# Patient Record
Sex: Male | Born: 1955
Health system: Southern US, Community
[De-identification: ages and names within clinical notes are randomized; demographics above are authoritative.]

## PROBLEM LIST (undated history)

## (undated) DIAGNOSIS — I10 Essential (primary) hypertension: Secondary | ICD-10-CM

## (undated) DIAGNOSIS — E119 Type 2 diabetes mellitus without complications: Secondary | ICD-10-CM

## (undated) DIAGNOSIS — E785 Hyperlipidemia, unspecified: Secondary | ICD-10-CM

## (undated) DIAGNOSIS — N529 Male erectile dysfunction, unspecified: Secondary | ICD-10-CM

## (undated) DIAGNOSIS — E559 Vitamin D deficiency, unspecified: Secondary | ICD-10-CM

## (undated) DIAGNOSIS — D126 Benign neoplasm of colon, unspecified: Secondary | ICD-10-CM

## (undated) DIAGNOSIS — M1611 Unilateral primary osteoarthritis, right hip: Secondary | ICD-10-CM

## (undated) DIAGNOSIS — N183 Chronic kidney disease, stage 3 unspecified: Secondary | ICD-10-CM

## (undated) HISTORY — PX: COLONOSCOPY: SHX174

## (undated) HISTORY — PX: APPENDECTOMY: SHX54

---

## 2008-05-17 ENCOUNTER — Ambulatory Visit: Payer: Self-pay | Admitting: Gastroenterology

## 2011-09-05 ENCOUNTER — Encounter: Payer: Self-pay | Admitting: Urology

## 2011-10-03 ENCOUNTER — Encounter: Payer: Self-pay | Admitting: Urology

## 2012-02-10 ENCOUNTER — Encounter: Payer: Self-pay | Admitting: Urology

## 2012-02-10 LAB — CBC WITH DIFFERENTIAL/PLATELET
Basophil #: 0 10*3/uL (ref 0.0–0.1)
HCT: 45.8 % (ref 40.0–52.0)
Lymphocyte #: 1.5 10*3/uL (ref 1.0–3.6)
MCHC: 32.1 g/dL (ref 32.0–36.0)
Monocyte #: 0.5 x10 3/mm (ref 0.2–1.0)
Monocyte %: 7 %
Neutrophil %: 67.7 %
Platelet: 148 10*3/uL — ABNORMAL LOW (ref 150–440)
WBC: 7 10*3/uL (ref 3.8–10.6)

## 2014-04-17 ENCOUNTER — Ambulatory Visit: Payer: Self-pay | Admitting: Gastroenterology

## 2014-04-20 LAB — PATHOLOGY REPORT

## 2016-04-12 ENCOUNTER — Encounter: Payer: Self-pay | Admitting: Emergency Medicine

## 2016-04-12 ENCOUNTER — Emergency Department
Admission: EM | Admit: 2016-04-12 | Discharge: 2016-04-12 | Disposition: A | Discharge status: Home / Self Care | Payer: Worker's Compensation | Attending: Emergency Medicine | Admitting: Emergency Medicine

## 2016-04-12 DIAGNOSIS — Y990 Civilian activity done for income or pay: Secondary | ICD-10-CM | POA: Diagnosis not present

## 2016-04-12 DIAGNOSIS — Y929 Unspecified place or not applicable: Secondary | ICD-10-CM | POA: Insufficient documentation

## 2016-04-12 DIAGNOSIS — T63331A Toxic effect of venom of brown recluse spider, accidental (unintentional), initial encounter: Secondary | ICD-10-CM | POA: Insufficient documentation

## 2016-04-12 DIAGNOSIS — Y9389 Activity, other specified: Secondary | ICD-10-CM | POA: Insufficient documentation

## 2016-04-12 DIAGNOSIS — T63301A Toxic effect of unspecified spider venom, accidental (unintentional), initial encounter: Secondary | ICD-10-CM

## 2016-04-12 DIAGNOSIS — X58XXXA Exposure to other specified factors, initial encounter: Secondary | ICD-10-CM | POA: Diagnosis not present

## 2016-04-12 DIAGNOSIS — Z87891 Personal history of nicotine dependence: Secondary | ICD-10-CM | POA: Diagnosis not present

## 2016-04-12 DIAGNOSIS — M25531 Pain in right wrist: Secondary | ICD-10-CM | POA: Diagnosis present

## 2016-04-12 MED ORDER — HYDROCORTISONE 2.5 % EX OINT: TOPICAL_OINTMENT | Freq: Two times a day (BID) | CUTANEOUS | 0 refills | Status: DC

## 2016-04-12 MED ORDER — NAPROXEN 500 MG PO TABS: 500.0000 mg | ORAL_TABLET | Freq: Two times a day (BID) | ORAL | 0 refills | Status: DC

## 2016-04-12 NOTE — ED Triage Notes (Signed)
Spider bite inner R wrist at work today, noted reddened and swollen.

## 2016-04-12 NOTE — ED Notes (Signed)
Patient reports bitten by ?spider today at approximately 10 am.  Patient reports area to right inner wrist with pain and swelling.  Slight discoloration noted to right inner wrist.

## 2016-04-12 NOTE — ED Notes (Signed)
Spoke with Mike Craze is the Regional Manager and he stated " We do not need urine or blood work done just take care of the bite". Contact number [phone] for any further questions

## 2016-04-12 NOTE — ED Notes (Signed)
Abby from registration could not find information for workers comp on [employer]. Attempted to call contact person Clement Husbands to find out if blood or urine needed to be obtained from patient for workers comp. They were closed and I got the voicemail.

## 2016-04-12 NOTE — ED Provider Notes (Signed)
[facility] Emergency Department Provider Note  ____________________________________________  Time seen: Approximately 7:17 PM  I have reviewed the triage vital signs and the nursing notes.   HISTORY  Chief Complaint Insect Bite   HPI CURRIE HEDRICK is a 60 y.o. male who presents to the emergency department for evaluation of wrist pain and swelling. He states that while at work today, he was bitten by a spider. He states that he felt something stinging/biting his wrist and when he rolled off his shirt sleeves he saw a little brown spider. He states that he killed the spider and went on working but the area has continued to swell and he now feels that his skin has changed and "feels like cow hide." He has taken Benadryl without relief.  History reviewed. No pertinent past medical history.  There are no active problems to display for this patient.   Past Surgical History:  Procedure Laterality Date  . APPENDECTOMY      Prior to Admission medications   Medication Sig Start Date End Date Taking? Authorizing Provider  Multiple Vitamin (MULTIVITAMIN) tablet Take 1 tablet by mouth daily.   Yes Historical Provider, MD  hydrocortisone 2.5 % ointment Apply topically 2 (two) times daily. 04/12/16   Victorino Dike, FNP  naproxen (NAPROSYN) 500 MG tablet Take 1 tablet (500 mg total) by mouth 2 (two) times daily with a meal. 04/12/16   Victorino Dike, FNP    Allergies Review of patient's allergies indicates no known allergies.  No family history on file.  Social History Social History  Substance Use Topics  . Smoking status: Former Research scientist (life sciences)  . Smokeless tobacco: Never Used  . Alcohol use Yes    Review of Systems  Constitutional: Negative for fever/chills Respiratory: Negative for shortness of breath. Musculoskeletal: Negative for pain. Skin: Positive for pain and erythema Neurological: Negative for headaches, focal weakness or  numbness. ____________________________________________   PHYSICAL EXAM:  VITAL SIGNS: ED Triage Vitals  Enc Vitals Group     BP 04/12/16 1901 (!) 142/103     Pulse Rate 04/12/16 1901 89     Resp 04/12/16 1901 18     Temp 04/12/16 1901 97.9 F (36.6 C)     Temp Source 04/12/16 1901 Oral     SpO2 04/12/16 1901 94 %     Weight 04/12/16 1902 223 lb (101.2 kg)     Height 04/12/16 1902 6\' 4"  (1.93 m)     Head Circumference --      Peak Flow --      Pain Score 04/12/16 1902 8     Pain Loc --      Pain Edu? --      Excl. in [place]? --      Constitutional: Alert and oriented. Well appearing and in no acute distress. Eyes: Conjunctivae are normal. EOMI. Nose: No congestion/rhinnorhea. Mouth/Throat: Mucous membranes are moist.   Neck: No stridor. Cardiovascular: Good peripheral circulation. Respiratory: Normal respiratory effort.  No retractions. Musculoskeletal: FROM throughout. Neurologic:  Normal speech and language. No gross focal neurologic deficits are appreciated. Skin:  Anterior aspect of the right wrist with mild edema and erythema. No lymphangitis noted.  ____________________________________________   LABS (all labs ordered are listed, but only abnormal results are displayed)  Labs Reviewed - No data to display ____________________________________________  EKG   ____________________________________________  RADIOLOGY  Not indicated ____________________________________________   PROCEDURES  Procedure(s) performed: None ____________________________________________   INITIAL IMPRESSION / ASSESSMENT AND PLAN / ED  COURSE  Pertinent labs & imaging results that were available during my care of the patient were reviewed by me and considered in my medical decision making (see chart for details).  He will be advised to use the hydrocortisone cream and take the Naprosyn as prescribed.  He was advised to follow up with [facility] clinic if not improving over the next  2-3 days.  He was also advised to return to the emergency department for symptoms that change or worsen if unable to schedule an appointment.  ____________________________________________   FINAL CLINICAL IMPRESSION(S) / ED DIAGNOSES  Final diagnoses:  Spider bite, accidental or unintentional, initial encounter    Discharge Medication List as of 04/12/2016  7:48 PM    START taking these medications   Details  hydrocortisone 2.5 % ointment Apply topically 2 (two) times daily., Starting Sat 04/12/2016, Print    naproxen (NAPROSYN) 500 MG tablet Take 1 tablet (500 mg total) by mouth 2 (two) times daily with a meal., Starting Sat 04/12/2016, Print        Note:  This document was prepared using Dragon voice recognition software and may include unintentional dictation errors.    Victorino Dike, FNP 04/12/16 1958    Earleen [place], MD 04/12/16 2258

## 2016-04-13 ENCOUNTER — Encounter: Payer: Self-pay | Admitting: *Deleted

## 2016-04-13 ENCOUNTER — Emergency Department
Admission: EM | Admit: 2016-04-13 | Discharge: 2016-04-13 | Disposition: A | Discharge status: Home / Self Care | Payer: Worker's Compensation | Attending: Emergency Medicine | Admitting: Emergency Medicine

## 2016-04-13 DIAGNOSIS — W57XXXA Bitten or stung by nonvenomous insect and other nonvenomous arthropods, initial encounter: Secondary | ICD-10-CM | POA: Diagnosis not present

## 2016-04-13 DIAGNOSIS — Z87891 Personal history of nicotine dependence: Secondary | ICD-10-CM | POA: Insufficient documentation

## 2016-04-13 DIAGNOSIS — L03113 Cellulitis of right upper limb: Secondary | ICD-10-CM | POA: Insufficient documentation

## 2016-04-13 DIAGNOSIS — S60861D Insect bite (nonvenomous) of right wrist, subsequent encounter: Secondary | ICD-10-CM | POA: Diagnosis present

## 2016-04-13 MED ORDER — CEPHALEXIN 500 MG PO CAPS: 500.0000 mg | ORAL_CAPSULE | Freq: Two times a day (BID) | ORAL | 0 refills | Status: DC

## 2016-04-13 NOTE — ED Triage Notes (Signed)
Pt was seen last night for a spider bite to right arm, pt reports arm is more painful and swollen today

## 2016-04-13 NOTE — ED Notes (Signed)
See triage note was seen last pm fro same  States this am his right arm is more swollen and tender to touch

## 2016-04-13 NOTE — ED Provider Notes (Signed)
[facility] Emergency Department Provider Note   ____________________________________________    I have reviewed the triage vital signs and the nursing notes.   HISTORY  Chief Complaint Insect Bite     HPI Martin Church is a 60 y.o. male who was seen yesterday for a spider bite on his right wrist. He returns today because he has developed mild erythema, mild swelling and burning sensation that is traveling away from the spider bite proximally. He denies fevers or chills. He lifts heavy objects for work and has increased pain   History reviewed. No pertinent past medical history.  There are no active problems to display for this patient.   Past Surgical History:  Procedure Laterality Date  . APPENDECTOMY      Prior to Admission medications   Medication Sig Start Date End Date Taking? Authorizing Provider  cephALEXin (KEFLEX) 500 MG capsule Take 1 capsule (500 mg total) by mouth 2 (two) times daily. 04/13/16   Lavonia Drafts, MD  hydrocortisone 2.5 % ointment Apply topically 2 (two) times daily. 04/12/16   Victorino Dike, FNP  Multiple Vitamin (MULTIVITAMIN) tablet Take 1 tablet by mouth daily.    Historical Provider, MD  naproxen (NAPROSYN) 500 MG tablet Take 1 tablet (500 mg total) by mouth 2 (two) times daily with a meal. 04/12/16   Victorino Dike, FNP     Allergies Review of patient's allergies indicates no known allergies.  No family history on file.  Social History Social History  Substance Use Topics  . Smoking status: Former Research scientist (life sciences)  . Smokeless tobacco: Never Used  . Alcohol use Yes    Review of Systems  Constitutional: No fever/chills     Gastrointestinal:   No nausea  Musculoskeletal: Forearm pain Skin: Mild erythema     ____________________________________________   PHYSICAL EXAM:  VITAL SIGNS: ED Triage Vitals  Enc Vitals Group     BP 04/13/16 0957 (!) 158/109     Pulse Rate 04/13/16 0957 83     Resp  04/13/16 0957 18     Temp 04/13/16 0957 98.1 F (36.7 C)     Temp Source 04/13/16 0957 Oral     SpO2 04/13/16 0957 98 %     Weight 04/13/16 0952 223 lb (101.2 kg)     Height 04/13/16 0952 6\' 4"  (1.93 m)     Head Circumference --      Peak Flow --      Pain Score 04/13/16 0953 9     Pain Loc --      Pain Edu? --      Excl. in [place]? --     Constitutional: Alert and oriented. No acute distress. Pleasant and interactive Eyes: Conjunctivae are normal.  Head: Atraumatic. Nose: No congestion/rhinnorhea. Mouth/Throat: Mucous membranes are moist.   Cardiovascular: Normal rate, regular rhythm.  Respiratory: Normal respiratory effort.  No retractions. Genitourinary: deferred Musculoskeletal: No lower extremity tenderness nor edema.   Neurologic:  Normal speech and language. No gross focal neurologic deficits are appreciated.   Skin:  Skin is warm, dry and intact. Mild erythema extending proximally from "spider bite" on the right wrist. He has mild tenderness to palpation along the erythema but no fluctuance. Consistent with cellulitis   ____________________________________________   LABS (all labs ordered are listed, but only abnormal results are displayed)  Labs Reviewed - No data to display ____________________________________________  EKG   ____________________________________________  RADIOLOGY  None ____________________________________________   PROCEDURES  Procedure(s) performed:  No    Critical Care performed: No ____________________________________________   INITIAL IMPRESSION / ASSESSMENT AND PLAN / ED COURSE  Pertinent labs & imaging results that were available during my care of the patient were reviewed by me and considered in my medical decision making (see chart for details).  Suspect cellulitis secondary to puncture wound, I'll start the patient on Keflex.   ____________________________________________   FINAL CLINICAL IMPRESSION(S) / ED  DIAGNOSES  Final diagnoses:  Cellulitis of right upper extremity      NEW MEDICATIONS STARTED DURING THIS VISIT:  New Prescriptions   CEPHALEXIN (KEFLEX) 500 MG CAPSULE    Take 1 capsule (500 mg total) by mouth 2 (two) times daily.     Note:  This document was prepared using Dragon voice recognition software and may include unintentional dictation errors.    Lavonia Drafts, MD 04/13/16 1007

## 2017-02-11 ENCOUNTER — Encounter (HOSPITAL_COMMUNITY): Payer: Self-pay | Admitting: Emergency Medicine

## 2017-02-11 ENCOUNTER — Ambulatory Visit (INDEPENDENT_AMBULATORY_CARE_PROVIDER_SITE_OTHER): Payer: Worker's Compensation

## 2017-02-11 ENCOUNTER — Ambulatory Visit (HOSPITAL_COMMUNITY)
Admission: EM | Admit: 2017-02-11 | Discharge: 2017-02-11 | Disposition: A | Discharge status: Home / Self Care | Payer: Worker's Compensation | Attending: Family Medicine | Admitting: Family Medicine

## 2017-02-11 DIAGNOSIS — R030 Elevated blood-pressure reading, without diagnosis of hypertension: Secondary | ICD-10-CM

## 2017-02-11 DIAGNOSIS — W231XXA Caught, crushed, jammed, or pinched between stationary objects, initial encounter: Secondary | ICD-10-CM | POA: Diagnosis not present

## 2017-02-11 DIAGNOSIS — S62666A Nondisplaced fracture of distal phalanx of right little finger, initial encounter for closed fracture: Secondary | ICD-10-CM

## 2017-02-11 DIAGNOSIS — S6991XA Unspecified injury of right wrist, hand and finger(s), initial encounter: Secondary | ICD-10-CM

## 2017-02-11 MED ORDER — HYDROCODONE-ACETAMINOPHEN 5-325 MG PO TABS: 1.0000 | ORAL_TABLET | Freq: Four times a day (QID) | ORAL | 0 refills | Status: DC | PRN

## 2017-02-11 NOTE — ED Provider Notes (Signed)
[place]    CSN: [id number] Arrival date & time: 02/11/17  1646     History   Chief Complaint Chief Complaint  Patient presents with  . Finger Injury    HPI DOMNIC VANTOL is a 61 y.o. male. Reports crush injury to R 5th finger approx 8 hours ago. 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 Immediately painful. Swollen. No bleeding. Ibuprofen 600mg  without much help. No sensation changes.  HPI  History reviewed. No pertinent past medical history.  There are no active problems to display for this patient.   Past Surgical History:  Procedure Laterality Date  . APPENDECTOMY         Home Medications    Prior to Admission medications   Medication Sig Start Date End Date Taking? Authorizing Provider  ibuprofen (ADVIL,MOTRIN) 800 MG tablet Take 800 mg by mouth every 8 (eight) hours as needed.   Yes [provider]  Multiple Vitamin (MULTIVITAMIN) tablet Take 1 tablet by mouth daily.   Yes [provider]    Family History History reviewed. No pertinent family history.  Social History Social History  Substance Use Topics  . Smoking status: Former Research scientist (life sciences)  . Smokeless tobacco: Never Used  . Alcohol use Yes     Allergies   Patient has no known allergies.   Review of Systems Review of Systems No sensation changes R 5th finger. No bleeding or breakage of skin. Decreased motion distal R 5th finger.  Physical Exam Triage Vital Signs ED Triage Vitals  Enc Vitals Group     BP 02/11/17 1703 (!) 151/86     Pulse Rate 02/11/17 1703 79     Resp 02/11/17 1703 18     Temp 02/11/17 1703 98 F (36.7 C)     Temp Source 02/11/17 1703 Oral     SpO2 02/11/17 1703 97 %     Weight --      Height --      Head Circumference --      Peak Flow --      Pain Score 02/11/17 1659 9     Pain Loc --      Pain Edu? --      Excl. in [place]? --    No data found.   Updated Vital Signs BP (!)  151/86 (BP Location: Left Arm)   Pulse 79   Temp 98 F (36.7 C) (Oral)   Resp 18   SpO2 97%    Physical Exam  Constitutional: He appears well-developed and well-nourished. No distress.  R 5th finger: Decreased ROM at DIP. Significant swelling distally. Tender to touch. No subungual hematoma. Mild ecchymosis. Nail intact. Sensation intact. Normal ROM at PIP.   UC Treatments / Results  Labs (all labs ordered are listed, but only abnormal results are displayed) Labs Reviewed - No data to display  EKG  EKG Interpretation None       Radiology No results found.  Procedures Procedures (including critical care time)  Medications Ordered in UC Medications - No data to display   Initial Impression / Assessment and Plan / UC Course  I have reviewed the triage vital signs and the nursing notes.  Pertinent labs & imaging results that were available during my care of the patient were reviewed by me and considered in my medical decision making (see chart for details).      Final Clinical Impressions(s) / UC Diagnoses   Final  diagnoses:  Finger injury, right, initial encounter  Elevated blood pressure reading in office without diagnosis of hypertension  Nondisplaced fracture of distal phalanx of right little finger, initial encounter for closed fracture   Comminuted distal finger fracture observed on x-ray. DIP splinted in full extension. To schedule f/u with [facility]. Number given. Ibuprofen as needed. Hydrocodone for prn use. Sedation precautions.  Work restriction note printed.  Recommended monitoring slightly increased BP reading while in office today. Recommend establishing care with PCP.   New Prescriptions Discharge Medication List as of 02/11/2017  6:10 PM    START taking these medications   Details  HYDROcodone-acetaminophen (NORCO/VICODIN) 5-325 MG tablet Take 1 tablet by mouth every 6 (six) hours as needed for moderate pain or severe pain.,  Starting Wed 02/11/2017, Print         Vanessa Kick, MD 02/11/17 [phone]

## 2017-02-11 NOTE — ED Triage Notes (Signed)
The patient presented to the [facility] with a complaint of pain and swelling to the fifth finger on his right hand secondary to a crane hitting it and pinning it against a burial vault earlier today.

## 2017-04-17 ENCOUNTER — Other Ambulatory Visit: Payer: Self-pay | Admitting: Internal Medicine

## 2017-04-17 DIAGNOSIS — R7989 Other specified abnormal findings of blood chemistry: Secondary | ICD-10-CM

## 2017-04-23 ENCOUNTER — Ambulatory Visit: Payer: PRIVATE HEALTH INSURANCE

## 2017-04-23 ENCOUNTER — Ambulatory Visit
Admission: RE | Admit: 2017-04-23 | Discharge: 2017-04-23 | Disposition: A | Discharge status: Home / Self Care | Payer: No Typology Code available for payment source | Source: Ambulatory Visit | Attending: Internal Medicine | Admitting: Internal Medicine

## 2017-04-23 DIAGNOSIS — N281 Cyst of kidney, acquired: Secondary | ICD-10-CM | POA: Insufficient documentation

## 2017-04-23 DIAGNOSIS — R7989 Other specified abnormal findings of blood chemistry: Secondary | ICD-10-CM | POA: Diagnosis present

## 2017-12-02 DIAGNOSIS — H524 Presbyopia: Secondary | ICD-10-CM | POA: Diagnosis not present

## 2017-12-02 DIAGNOSIS — H52223 Regular astigmatism, bilateral: Secondary | ICD-10-CM | POA: Diagnosis not present

## 2017-12-02 DIAGNOSIS — H5203 Hypermetropia, bilateral: Secondary | ICD-10-CM | POA: Diagnosis not present

## 2017-12-23 DIAGNOSIS — N401 Enlarged prostate with lower urinary tract symptoms: Secondary | ICD-10-CM | POA: Diagnosis not present

## 2017-12-23 DIAGNOSIS — E291 Testicular hypofunction: Secondary | ICD-10-CM | POA: Diagnosis not present

## 2017-12-23 DIAGNOSIS — Z79899 Other long term (current) drug therapy: Secondary | ICD-10-CM | POA: Diagnosis not present

## 2018-01-11 DIAGNOSIS — N5201 Erectile dysfunction due to arterial insufficiency: Secondary | ICD-10-CM | POA: Diagnosis not present

## 2018-01-11 DIAGNOSIS — N401 Enlarged prostate with lower urinary tract symptoms: Secondary | ICD-10-CM | POA: Diagnosis not present

## 2018-01-11 DIAGNOSIS — Z79899 Other long term (current) drug therapy: Secondary | ICD-10-CM | POA: Diagnosis not present

## 2018-01-11 DIAGNOSIS — E291 Testicular hypofunction: Secondary | ICD-10-CM | POA: Diagnosis not present

## 2018-05-30 IMAGING — US US RENAL
1 series · 14 of 25 positions shown · non-contrast
Comparison: None.

CLINICAL DATA: Elevated creatinine

EXAM:
RENAL / URINARY TRACT ULTRASOUND COMPLETE

[Series 1: us renal · 0.25mm/px · 14 of 43 slices shown]
[im 1/43]
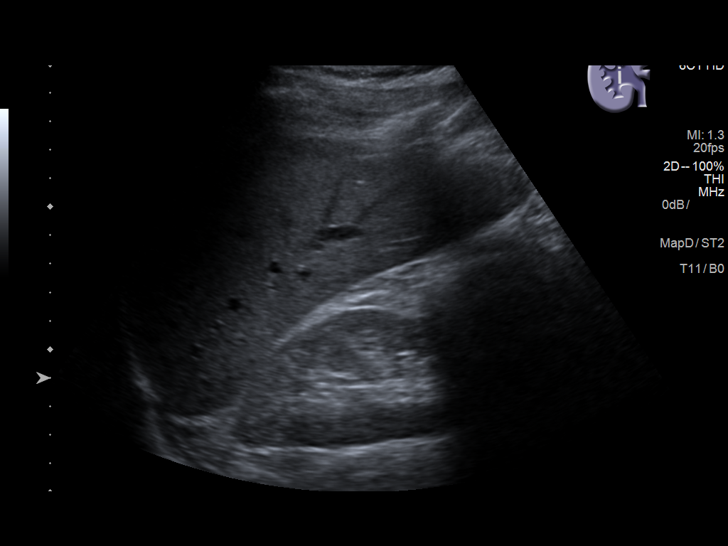
[im 4/43]
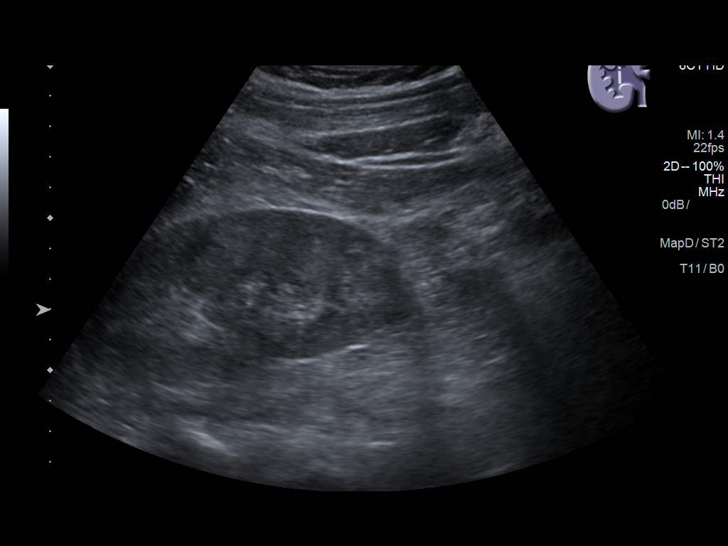
[im 8/43]
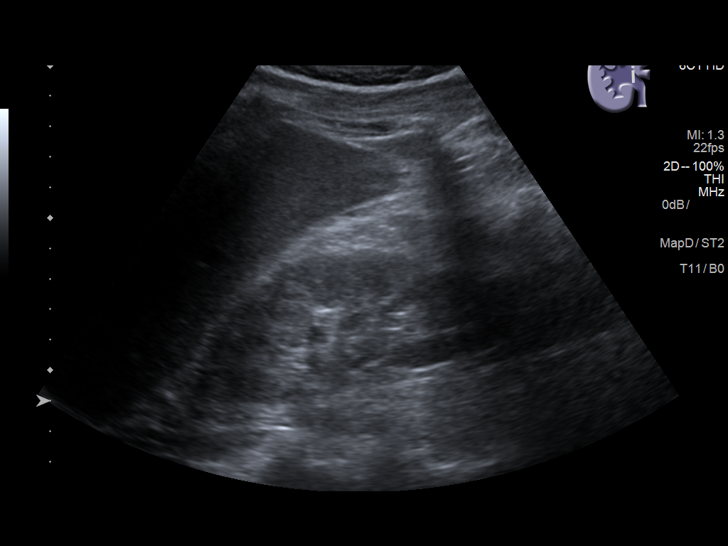
[im 11/43]
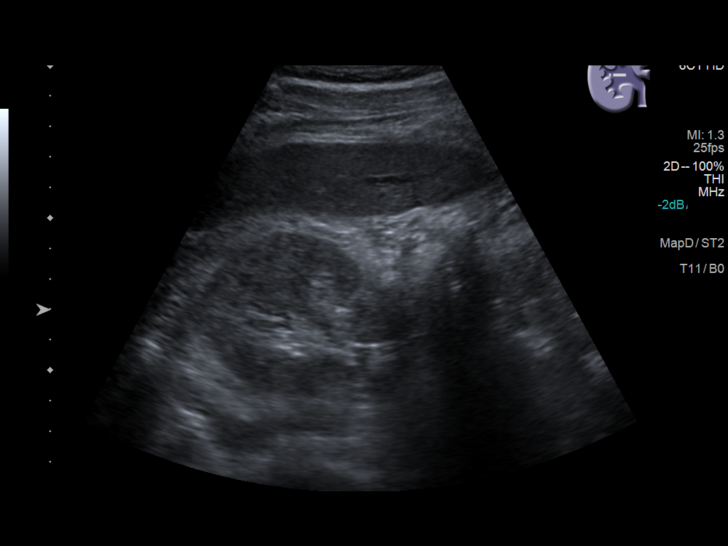
[im 15/43]
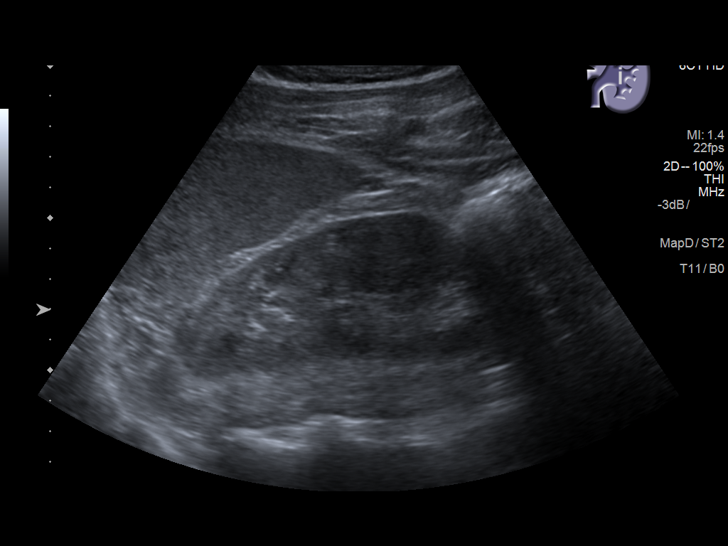
[im 16/43]
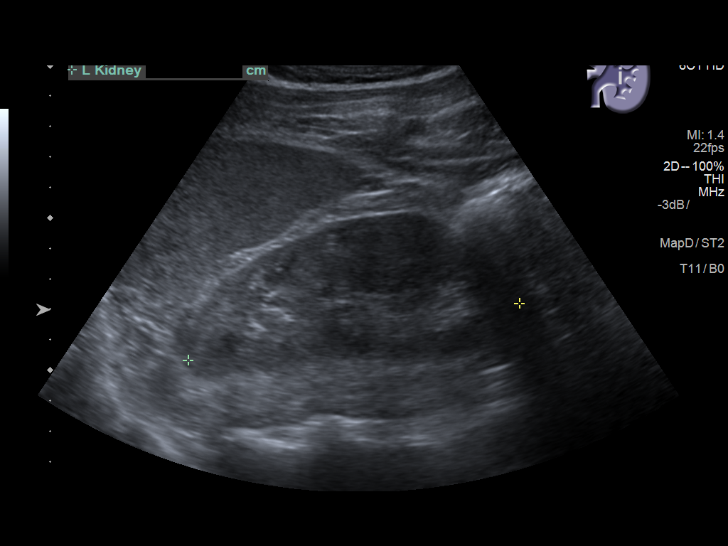
[im 20/43]
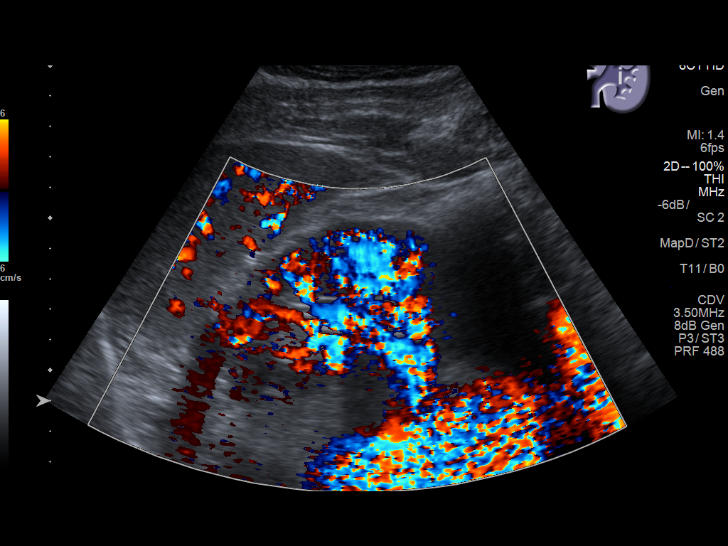
[im 23/43]
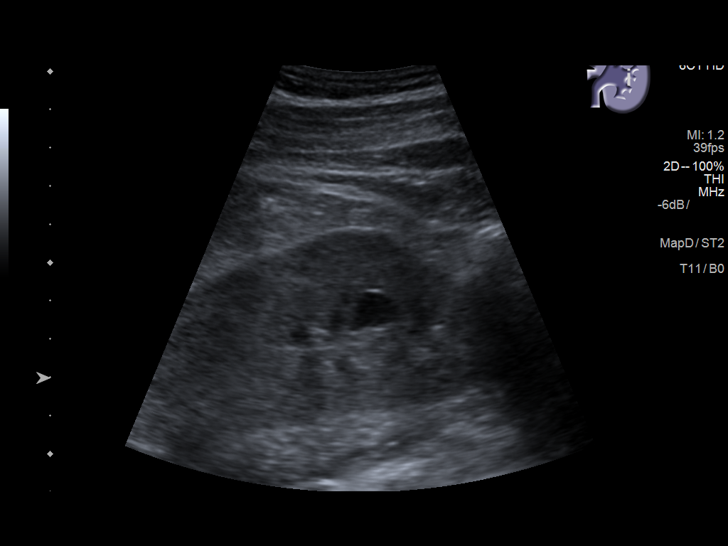
[im 27/43]
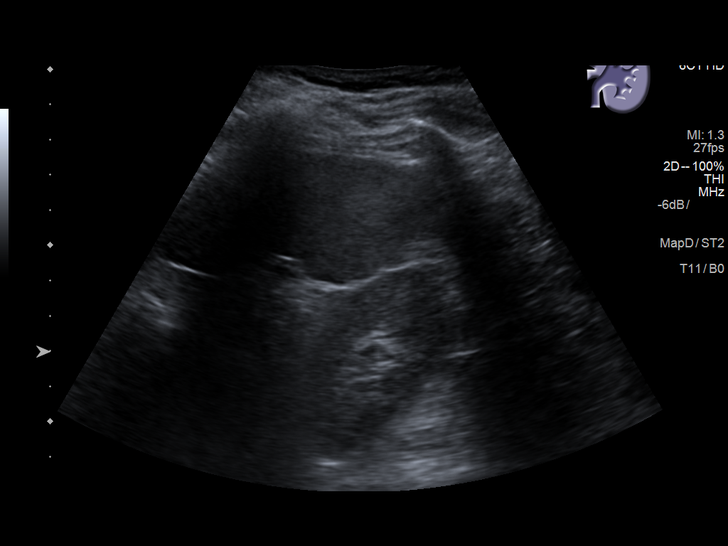
[im 29/43]
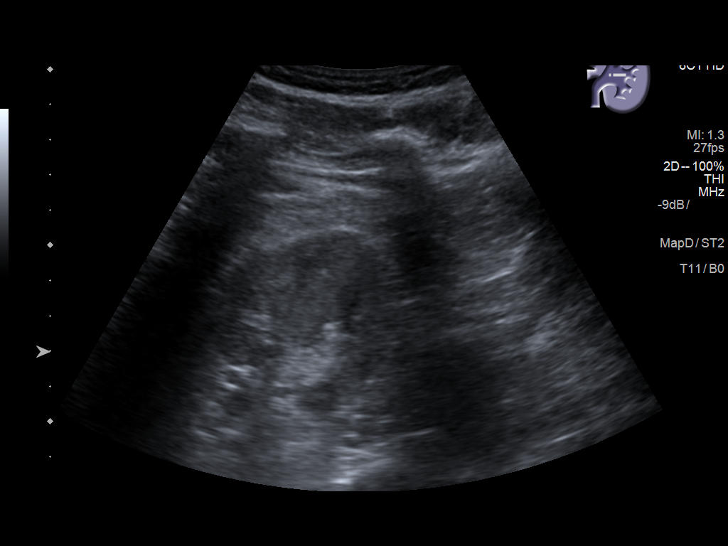
[im 32/43]
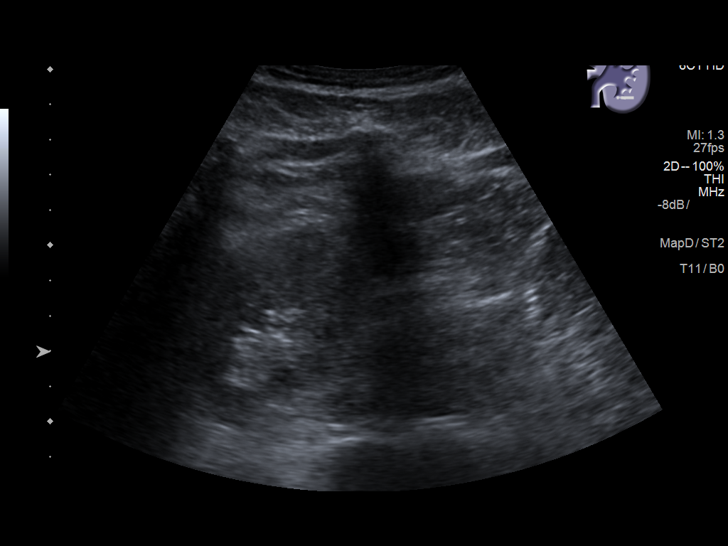
[im 36/43]
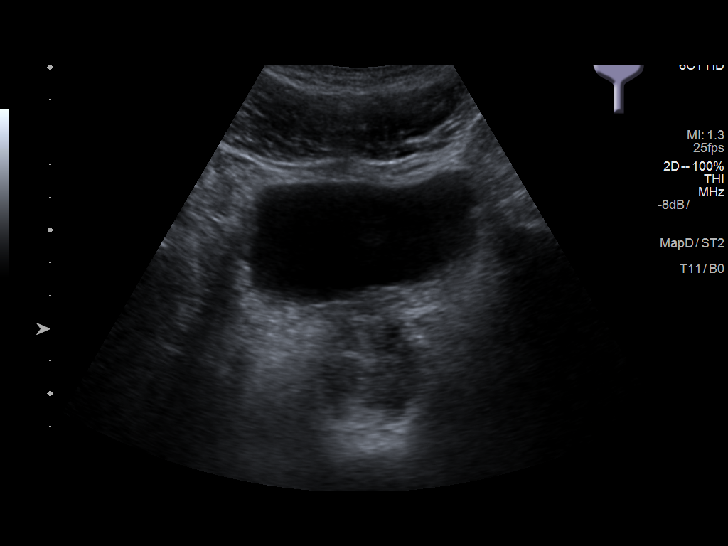
[im 39/43]
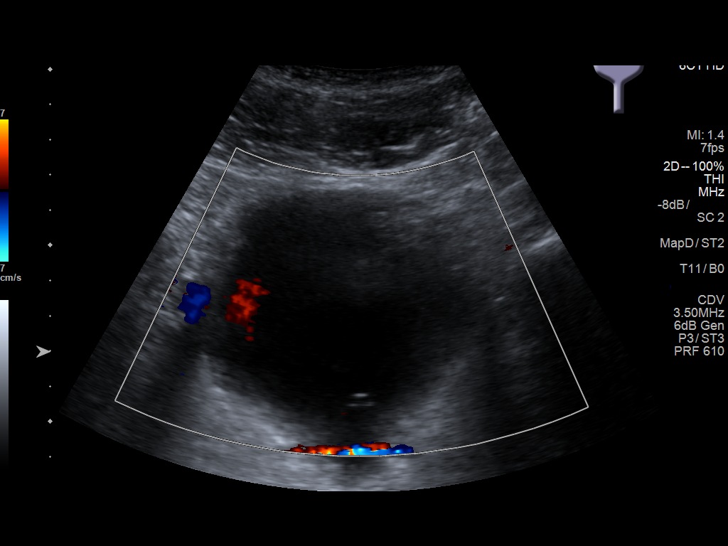
[im 43/43]
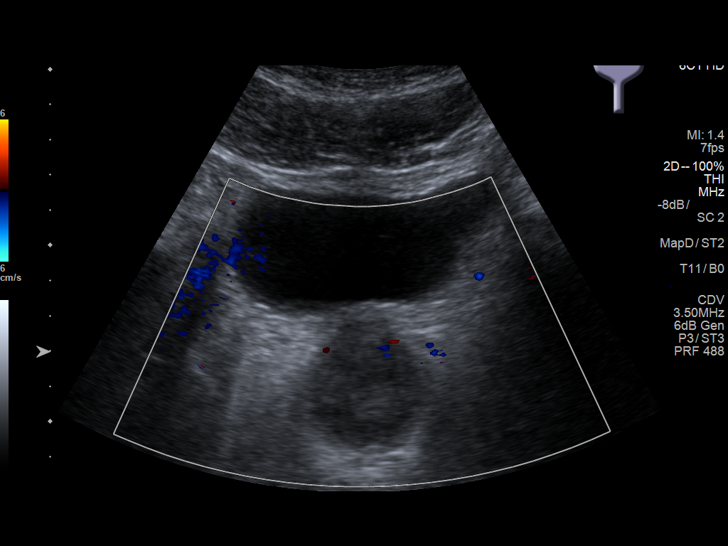

[14 of 25 positions shown; findings below may reference images not displayed]

FINDINGS: Right Kidney:

Length: 11.3 cm. Echogenicity within normal limits. No mass or
hydronephrosis visualized.

Left Kidney:

Length: 11 cm.  Contains a 1.3 cm cyst

Bladder:

Appears normal for degree of bladder distention.
IMPRESSION: 1. Small left renal cyst.  No acute renal abnormalities.
2. The prostate measures 3.9 x 3.5 x 3.5 cm.

## 2018-06-09 DIAGNOSIS — Z79899 Other long term (current) drug therapy: Secondary | ICD-10-CM | POA: Diagnosis not present

## 2018-06-09 DIAGNOSIS — N401 Enlarged prostate with lower urinary tract symptoms: Secondary | ICD-10-CM | POA: Diagnosis not present

## 2018-06-09 DIAGNOSIS — E291 Testicular hypofunction: Secondary | ICD-10-CM | POA: Diagnosis not present

## 2018-06-18 ENCOUNTER — Ambulatory Visit
Admission: RE | Admit: 2018-06-18 | Discharge: 2018-06-18 | Disposition: A | Discharge status: Home / Self Care | Payer: 59 | Source: Ambulatory Visit | Attending: Urology | Admitting: Urology

## 2018-06-18 DIAGNOSIS — Z79899 Other long term (current) drug therapy: Secondary | ICD-10-CM | POA: Insufficient documentation

## 2018-06-18 DIAGNOSIS — D751 Secondary polycythemia: Secondary | ICD-10-CM | POA: Diagnosis not present

## 2018-06-18 LAB — CBC
HCT: 52 % (ref 39.0–52.0)
HEMOGLOBIN: 17.6 g/dL — AB (ref 13.0–17.0)
MCH: 28.1 pg (ref 26.0–34.0)
MCHC: 33.8 g/dL (ref 30.0–36.0)
MCV: 83.1 fL (ref 80.0–100.0)
NRBC: 0 % (ref 0.0–0.2)
PLATELETS: 153 10*3/uL (ref 150–400)
RBC: 6.26 MIL/uL — AB (ref 4.22–5.81)
RDW: 12.2 % (ref 11.5–15.5)
WBC: 3.9 10*3/uL — ABNORMAL LOW (ref 4.0–10.5)

## 2018-06-18 NOTE — Progress Notes (Signed)
Phlebotomy performed: 500 ml drained off. CBC sample sent to lab.Pt. Tolerated procedure well. Asked pt. To monitor BP at home and call primary MD office if remains elevated. Pt. Verbalized understanding. Asymptomatic on arrival and upon DC home. Stable for DC.

## 2018-06-25 ENCOUNTER — Ambulatory Visit
Admission: RE | Admit: 2018-06-25 | Discharge: 2018-06-25 | Disposition: A | Discharge status: Home / Self Care | Payer: 59 | Source: Ambulatory Visit | Attending: Orthopedic Surgery | Admitting: Orthopedic Surgery

## 2018-06-25 DIAGNOSIS — D751 Secondary polycythemia: Secondary | ICD-10-CM | POA: Insufficient documentation

## 2018-06-25 DIAGNOSIS — Z79899 Other long term (current) drug therapy: Secondary | ICD-10-CM | POA: Diagnosis not present

## 2018-06-25 LAB — HEMOGLOBIN AND HEMATOCRIT, BLOOD
HEMATOCRIT: 46.6 % (ref 39.0–52.0)
HEMOGLOBIN: 15.4 g/dL (ref 13.0–17.0)

## 2018-06-25 NOTE — Discharge Instructions (Signed)
[facility] INSTRUCTIONS FOR THERAPEUTIC PHLEBOTOMY  1.  Remain in [facility] Recovery for 10 to 15 minutes after the phlebotomy has been performed.  Do not leave until you are released by the nurse.   2. Drink more fluids than usual for about four hours after you have had a blood draw.  It is advised that you do not have any alcohol until you have eaten.  Do not smoke for one half hour.  3. If there is bleeding from the phlebotomy site, elevate your arm and apply direct pressure over the needle puncture site.  If this does not stop the bleeding, you should return to the [place] area or notify your physician as soon as possible.  4. If you feel fain or dizzy, lie down immediately, if possible, or sit with your head bent forward towards your knees.  Stay seated or lying flat until all dizziness has passed.  Drink plenty of fluids as soon as you are able and keep your activity to a minimum for at least an hour.   5. If any of the above symptoms persist contact your primary care physician or closest emergency room.   6. You may resume your normal activities after about one-half hour if you are feeling the same as you usually do.  7. Remove the bandage after 4 hours.

## 2018-08-04 DIAGNOSIS — E291 Testicular hypofunction: Secondary | ICD-10-CM | POA: Diagnosis not present

## 2018-08-16 DIAGNOSIS — E291 Testicular hypofunction: Secondary | ICD-10-CM | POA: Diagnosis not present

## 2018-08-16 DIAGNOSIS — N401 Enlarged prostate with lower urinary tract symptoms: Secondary | ICD-10-CM | POA: Diagnosis not present

## 2018-08-16 DIAGNOSIS — N5201 Erectile dysfunction due to arterial insufficiency: Secondary | ICD-10-CM | POA: Diagnosis not present

## 2019-01-03 DIAGNOSIS — N401 Enlarged prostate with lower urinary tract symptoms: Secondary | ICD-10-CM | POA: Diagnosis not present

## 2019-01-03 DIAGNOSIS — E291 Testicular hypofunction: Secondary | ICD-10-CM | POA: Diagnosis not present

## 2019-01-03 DIAGNOSIS — Z79899 Other long term (current) drug therapy: Secondary | ICD-10-CM | POA: Diagnosis not present

## 2019-02-14 DIAGNOSIS — E291 Testicular hypofunction: Secondary | ICD-10-CM | POA: Diagnosis not present

## 2019-02-23 DIAGNOSIS — N5201 Erectile dysfunction due to arterial insufficiency: Secondary | ICD-10-CM | POA: Diagnosis not present

## 2019-02-23 DIAGNOSIS — E291 Testicular hypofunction: Secondary | ICD-10-CM | POA: Diagnosis not present

## 2019-05-07 IMAGING — DX DG FINGER LITTLE 2+V*R*
2 series · 2 of 2 positions shown · non-contrast
Comparison: None

CLINICAL DATA: 60-year-old male status post smash injury to the
right fifth finger today. Pain and swelling.

EXAM:
RIGHT LITTLE FINGER 2+V

[finger ap]
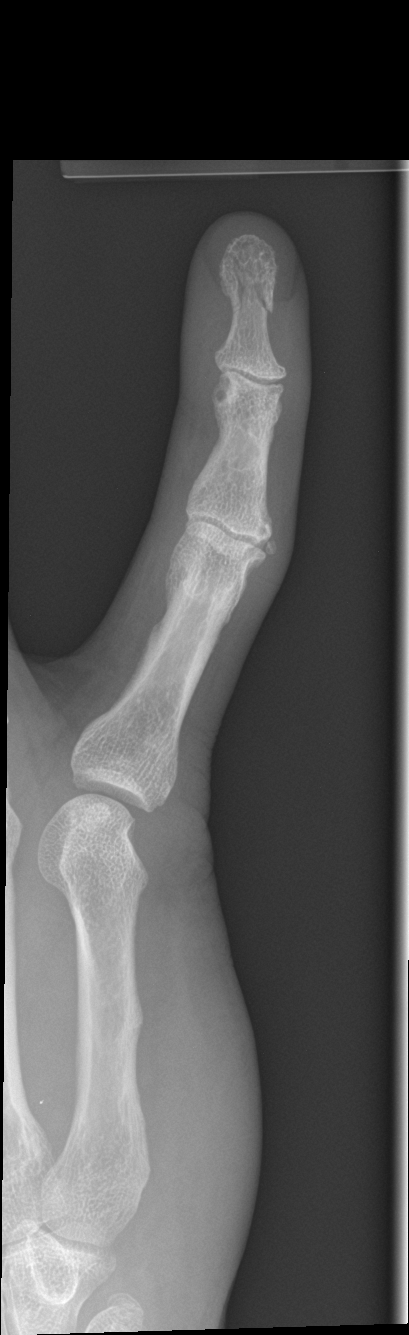

[finger obl]
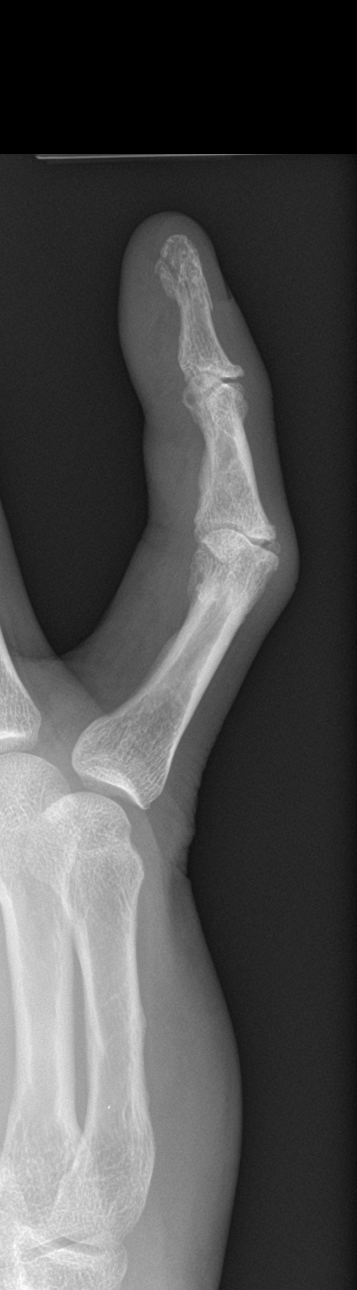

[2 of 2 positions shown; findings below may reference images not displayed]

FINDINGS: Comminuted fracture through the tuft of the right fifth distal
phalanx with minimal displacement. The fifth DIP appears intact. The
fifth middle and proximal phalanx appear intact with some
degenerative spurring. Remote/healed fracture of the fifth
metacarpal.
IMPRESSION: Comminuted but minimally displaced fracture of the tuft of the right
fifth distal phalanx.

Superimposed fifth PIP degeneration and prior fracture of the fifth
metacarpal.

## 2019-08-02 DIAGNOSIS — E291 Testicular hypofunction: Secondary | ICD-10-CM | POA: Diagnosis not present

## 2019-08-02 DIAGNOSIS — N401 Enlarged prostate with lower urinary tract symptoms: Secondary | ICD-10-CM | POA: Diagnosis not present

## 2019-08-02 DIAGNOSIS — Z79899 Other long term (current) drug therapy: Secondary | ICD-10-CM | POA: Diagnosis not present

## 2019-08-05 ENCOUNTER — Ambulatory Visit
Admission: RE | Admit: 2019-08-05 | Discharge: 2019-08-05 | Disposition: A | Discharge status: Home / Self Care | Payer: PRIVATE HEALTH INSURANCE | Source: Ambulatory Visit | Attending: Urology | Admitting: Urology

## 2019-08-05 ENCOUNTER — Other Ambulatory Visit: Payer: Self-pay

## 2019-08-05 DIAGNOSIS — D751 Secondary polycythemia: Secondary | ICD-10-CM | POA: Insufficient documentation

## 2019-08-12 ENCOUNTER — Other Ambulatory Visit: Payer: Self-pay

## 2019-08-12 ENCOUNTER — Ambulatory Visit
Admission: RE | Admit: 2019-08-12 | Discharge: 2019-08-12 | Disposition: A | Discharge status: Home / Self Care | Payer: PRIVATE HEALTH INSURANCE | Source: Ambulatory Visit | Attending: Urology | Admitting: Urology

## 2019-08-12 DIAGNOSIS — D751 Secondary polycythemia: Secondary | ICD-10-CM | POA: Insufficient documentation

## 2019-08-12 DIAGNOSIS — Z79899 Other long term (current) drug therapy: Secondary | ICD-10-CM | POA: Insufficient documentation

## 2019-08-12 LAB — HEMOGLOBIN AND HEMATOCRIT, BLOOD
HCT: 44.7 % (ref 39.0–52.0)
Hemoglobin: 15.3 g/dL (ref 13.0–17.0)

## 2019-08-12 NOTE — Progress Notes (Signed)
Patient here to have 2nd of two units removed via therapeutic phlebotomy. Called Dr [facility] office and spoke with his RN to get verbal order to draw Hgb/hct post procedure. Also requested results of previous labwork drawn on behalf of patient, telephone results reported by RN are as follows Hct 53.6, hgb 17.6.  Patient tolerated procedure well.

## 2019-09-09 DIAGNOSIS — E291 Testicular hypofunction: Secondary | ICD-10-CM | POA: Diagnosis not present

## 2019-09-09 DIAGNOSIS — Z7689 Persons encountering health services in other specified circumstances: Secondary | ICD-10-CM | POA: Diagnosis not present

## 2019-09-09 DIAGNOSIS — Z79899 Other long term (current) drug therapy: Secondary | ICD-10-CM | POA: Diagnosis not present

## 2019-11-15 DIAGNOSIS — E291 Testicular hypofunction: Secondary | ICD-10-CM | POA: Diagnosis not present

## 2020-02-21 DIAGNOSIS — E291 Testicular hypofunction: Secondary | ICD-10-CM | POA: Diagnosis not present

## 2020-02-21 DIAGNOSIS — Z79899 Other long term (current) drug therapy: Secondary | ICD-10-CM | POA: Diagnosis not present

## 2020-02-21 DIAGNOSIS — N401 Enlarged prostate with lower urinary tract symptoms: Secondary | ICD-10-CM | POA: Diagnosis not present

## 2020-05-28 DIAGNOSIS — E291 Testicular hypofunction: Secondary | ICD-10-CM | POA: Diagnosis not present

## 2020-06-15 DIAGNOSIS — Z79899 Other long term (current) drug therapy: Secondary | ICD-10-CM | POA: Diagnosis not present

## 2020-06-15 DIAGNOSIS — E291 Testicular hypofunction: Secondary | ICD-10-CM | POA: Diagnosis not present

## 2020-06-15 DIAGNOSIS — N5201 Erectile dysfunction due to arterial insufficiency: Secondary | ICD-10-CM | POA: Diagnosis not present

## 2020-11-19 DIAGNOSIS — E291 Testicular hypofunction: Secondary | ICD-10-CM | POA: Diagnosis not present

## 2020-11-19 DIAGNOSIS — N401 Enlarged prostate with lower urinary tract symptoms: Secondary | ICD-10-CM | POA: Diagnosis not present

## 2020-11-19 DIAGNOSIS — Z79899 Other long term (current) drug therapy: Secondary | ICD-10-CM | POA: Diagnosis not present

## 2020-12-25 DIAGNOSIS — Z23 Encounter for immunization: Secondary | ICD-10-CM | POA: Diagnosis not present

## 2020-12-25 DIAGNOSIS — N1831 Chronic kidney disease, stage 3a: Secondary | ICD-10-CM | POA: Diagnosis not present

## 2020-12-25 DIAGNOSIS — Z0000 Encounter for general adult medical examination without abnormal findings: Secondary | ICD-10-CM | POA: Diagnosis not present

## 2020-12-25 DIAGNOSIS — Z1211 Encounter for screening for malignant neoplasm of colon: Secondary | ICD-10-CM | POA: Diagnosis not present

## 2021-01-03 DIAGNOSIS — E291 Testicular hypofunction: Secondary | ICD-10-CM | POA: Diagnosis not present

## 2021-01-15 DIAGNOSIS — N5201 Erectile dysfunction due to arterial insufficiency: Secondary | ICD-10-CM | POA: Diagnosis not present

## 2021-01-15 DIAGNOSIS — E291 Testicular hypofunction: Secondary | ICD-10-CM | POA: Diagnosis not present

## 2021-01-15 DIAGNOSIS — Z79899 Other long term (current) drug therapy: Secondary | ICD-10-CM | POA: Diagnosis not present

## 2021-01-15 DIAGNOSIS — N401 Enlarged prostate with lower urinary tract symptoms: Secondary | ICD-10-CM | POA: Diagnosis not present

## 2021-03-06 ENCOUNTER — Other Ambulatory Visit: Payer: Self-pay

## 2021-03-06 DIAGNOSIS — Z86010 Personal history of colonic polyps: Secondary | ICD-10-CM | POA: Diagnosis not present

## 2021-03-06 MED ORDER — PEG 3350-KCL-NA BICARB-NACL 420 G PO SOLR
ORAL | 0 refills | Status: DC
  Filled 2021-03-06 – 2021-03-19 (×2): qty 4000, 1d supply, fill #0

## 2021-03-18 ENCOUNTER — Other Ambulatory Visit: Payer: Self-pay

## 2021-03-19 ENCOUNTER — Other Ambulatory Visit: Payer: Self-pay

## 2021-06-17 DIAGNOSIS — N401 Enlarged prostate with lower urinary tract symptoms: Secondary | ICD-10-CM | POA: Diagnosis not present

## 2021-06-17 DIAGNOSIS — Z79899 Other long term (current) drug therapy: Secondary | ICD-10-CM | POA: Diagnosis not present

## 2021-06-17 DIAGNOSIS — E291 Testicular hypofunction: Secondary | ICD-10-CM | POA: Diagnosis not present

## 2021-06-27 ENCOUNTER — Encounter: Payer: Self-pay | Admitting: *Deleted

## 2021-06-28 ENCOUNTER — Ambulatory Visit: Payer: 59 | Admitting: Certified Registered Nurse Anesthetist

## 2021-06-28 ENCOUNTER — Encounter
Admission: RE | Disposition: A | Discharge status: Home / Self Care | Payer: Self-pay | Source: Ambulatory Visit | Attending: Gastroenterology

## 2021-06-28 ENCOUNTER — Ambulatory Visit
Admission: RE | Admit: 2021-06-28 | Discharge: 2021-06-28 | Disposition: A | Discharge status: Home / Self Care | Payer: 59 | Source: Ambulatory Visit | Attending: Gastroenterology | Admitting: Gastroenterology

## 2021-06-28 DIAGNOSIS — Z1211 Encounter for screening for malignant neoplasm of colon: Secondary | ICD-10-CM | POA: Diagnosis not present

## 2021-06-28 DIAGNOSIS — Z79899 Other long term (current) drug therapy: Secondary | ICD-10-CM | POA: Diagnosis not present

## 2021-06-28 DIAGNOSIS — D124 Benign neoplasm of descending colon: Secondary | ICD-10-CM | POA: Insufficient documentation

## 2021-06-28 DIAGNOSIS — D122 Benign neoplasm of ascending colon: Secondary | ICD-10-CM | POA: Insufficient documentation

## 2021-06-28 DIAGNOSIS — D123 Benign neoplasm of transverse colon: Secondary | ICD-10-CM | POA: Diagnosis not present

## 2021-06-28 DIAGNOSIS — K649 Unspecified hemorrhoids: Secondary | ICD-10-CM | POA: Diagnosis not present

## 2021-06-28 DIAGNOSIS — K640 First degree hemorrhoids: Secondary | ICD-10-CM | POA: Insufficient documentation

## 2021-06-28 DIAGNOSIS — K635 Polyp of colon: Secondary | ICD-10-CM | POA: Diagnosis not present

## 2021-06-28 DIAGNOSIS — Z86010 Personal history of colonic polyps: Secondary | ICD-10-CM | POA: Diagnosis not present

## 2021-06-28 HISTORY — DX: Hyperlipidemia, unspecified: E78.5

## 2021-06-28 HISTORY — PX: COLONOSCOPY WITH PROPOFOL: SHX5780

## 2021-06-28 HISTORY — DX: Male erectile dysfunction, unspecified: N52.9

## 2021-06-28 HISTORY — DX: Vitamin D deficiency, unspecified: E55.9

## 2021-06-28 SURGERY — COLONOSCOPY WITH PROPOFOL
Anesthesia: General

## 2021-06-28 MED ORDER — PROPOFOL 500 MG/50ML IV EMUL
INTRAVENOUS | Status: DC | PRN
  Administered 2021-06-28: 150 ug/kg/min via INTRAVENOUS

## 2021-06-28 MED ORDER — LIDOCAINE HCL (CARDIAC) PF 100 MG/5ML IV SOSY
PREFILLED_SYRINGE | INTRAVENOUS | Status: DC | PRN
  Administered 2021-06-28: 50 mg via INTRAVENOUS

## 2021-06-28 MED ORDER — PROPOFOL 10 MG/ML IV BOLUS
INTRAVENOUS | Status: DC | PRN
  Administered 2021-06-28 (×2): 20 mg via INTRAVENOUS
  Administered 2021-06-28: 60 mg via INTRAVENOUS

## 2021-06-28 MED ORDER — SODIUM CHLORIDE 0.9 % IV SOLN
INTRAVENOUS | Status: DC
  Administered 2021-06-28: 20 mL/h via INTRAVENOUS

## 2021-06-28 NOTE — Anesthesia Postprocedure Evaluation (Signed)
Anesthesia Post Note  Patient: Martin Church  Procedure(s) Performed: COLONOSCOPY WITH PROPOFOL  Patient location during evaluation: PACU Anesthesia Type: General Level of consciousness: awake and alert, oriented and patient cooperative Pain management: pain level controlled Vital Signs Assessment: post-procedure vital signs reviewed and stable Respiratory status: spontaneous breathing, nonlabored ventilation and respiratory function stable Cardiovascular status: blood pressure returned to baseline and stable Postop Assessment: adequate PO intake Anesthetic complications: no   No notable events documented.   Last Vitals:  Vitals:   06/28/21 0910 06/28/21 0920  BP: (!) 123/96 (!) 146/107  Pulse:    Resp:    Temp:    SpO2:      Last Pain:  Vitals:   06/28/21 0920  TempSrc:   PainSc: 0-No pain                 Darrin Nipper

## 2021-06-28 NOTE — H&P (Signed)
Outpatient short stay form Pre-procedure 06/28/2021  Lesly Rubenstein, MD  Primary Physician: Kirk Ruths, MD  Reason for visit:  Surveillance colonoscopy  History of present illness:   65 y/o gentleman with history of HLD here for surveillance colonoscopy for history of > 3 Ta's on last colonoscopy done in 2015. History of appendectomy. No blood thinners. No family history of GI malignancies.    Current Facility-Administered Medications:    0.9 %  sodium chloride infusion, , Intravenous, Continuous, Pamalee Marcoe, Hilton Cork, MD, Last Rate: 20 mL/hr at 06/28/21 0743, 20 mL/hr at 06/28/21 0743  Medications Prior to Admission  Medication Sig Dispense Refill Last Dose   Bee Pollen 1000 MG TABS Take 1 tablet by mouth.   Past Week   ferrous sulfate 324 MG TBEC Take 324 mg by mouth daily with breakfast.   Past Week   HYDROcodone-acetaminophen (NORCO/VICODIN) 5-325 MG tablet Take 1 tablet by mouth every 6 (six) hours as needed for moderate pain or severe pain. 10 tablet 0 Past Week   ibuprofen (ADVIL,MOTRIN) 800 MG tablet Take 800 mg by mouth every 8 (eight) hours as needed.   Past Week   Multiple Vitamin (MULTIVITAMIN) tablet Take 1 tablet by mouth daily.   Past Week   polyethylene glycol-electrolytes (NULYTELY) 420 g solution Take 4,000 mLs by mouth as directed Split Colon Prep. 4000 mL 0 Past Week   sildenafil (REVATIO) 20 MG tablet Take 20 mg by mouth daily as needed. 2-5 tablets   Past Week   vitamin E 180 MG (400 UNITS) capsule Take 400 Units by mouth daily.   Past Week     No Known Allergies   Past Medical History:  Diagnosis Date   ED (erectile dysfunction)    HLD (hyperlipidemia)    Vitamin D deficiency     Review of systems:  Otherwise negative.    Physical Exam  Gen: Alert, oriented. Appears stated age.  HEENT: PERRLA. Lungs: No respiratory distress CV: RRR Abd: soft, benign, no masses Ext: No edema    Planned procedures: Proceed with colonoscopy. The  patient understands the nature of the planned procedure, indications, risks, alternatives and potential complications including but not limited to bleeding, infection, perforation, damage to internal organs and possible oversedation/side effects from anesthesia. The patient agrees and gives consent to proceed.  Please refer to procedure notes for findings, recommendations and patient disposition/instructions.     Lesly Rubenstein, MD [facility]

## 2021-06-28 NOTE — Anesthesia Preprocedure Evaluation (Signed)
Anesthesia Evaluation  Patient identified by MRN, date of birth, ID band Patient awake    Reviewed: Allergy & Precautions, NPO status , Patient's Chart, lab work & pertinent test results  History of Anesthesia Complications Negative for: history of anesthetic complications  Airway Mallampati: II   Neck ROM: Full    Dental   Pulmonary neg pulmonary ROS,    Pulmonary exam normal breath sounds clear to auscultation       Cardiovascular Exercise Tolerance: Good negative cardio ROS Normal cardiovascular exam Rhythm:Regular Rate:Normal     Neuro/Psych negative neurological ROS     GI/Hepatic negative GI ROS,   Endo/Other  negative endocrine ROS  Renal/GU Renal disease (stage III CKD)     Musculoskeletal   Abdominal   Peds  Hematology negative hematology ROS (+)   Anesthesia Other Findings   Reproductive/Obstetrics                             Anesthesia Physical Anesthesia Plan  ASA: 2  Anesthesia Plan: General   Post-op Pain Management:    Induction: Intravenous  PONV Risk Score and Plan: 2 and Propofol infusion, TIVA and Treatment may vary due to age or medical condition  Airway Management Planned: Natural Airway  Additional Equipment:   Intra-op Plan:   Post-operative Plan:   Informed Consent: I have reviewed the patients History and Physical, chart, labs and discussed the procedure including the risks, benefits and alternatives for the proposed anesthesia with the patient or authorized representative who has indicated his/her understanding and acceptance.       Plan Discussed with: CRNA  Anesthesia Plan Comments:         Anesthesia Quick Evaluation

## 2021-06-28 NOTE — Transfer of Care (Signed)
Immediate Anesthesia Transfer of Care Note  Patient: Martin Church  Procedure(s) Performed: COLONOSCOPY WITH PROPOFOL  Patient Location: PACU  Anesthesia Type:General  Level of Consciousness: drowsy  Airway & Oxygen Therapy: Patient Spontanous Breathing  Post-op Assessment: Report given to RN and Post -op Vital signs reviewed and stable  Post vital signs: Reviewed and stable  Last Vitals:  Vitals Value Taken Time  BP 112/78 06/28/21 0900  Temp 36.5 C 06/28/21 0900  Pulse 87 06/28/21 0901  Resp 13 06/28/21 0901  SpO2 94 % 06/28/21 0901    Last Pain:  Vitals:   06/28/21 0900  TempSrc: Temporal  PainSc: Asleep         Complications: No notable events documented.

## 2021-06-28 NOTE — Op Note (Signed)
[facility] Patient Name: Martin Church Procedure Date: 06/28/2021 8:17 AM MRN: [id number] Account #: [id number] Date of Birth: 01-20-1956 Admit Type: Outpatient Age: 65 Room: [facility] ENDO ROOM 3 Gender: Male Note Status: Finalized Instrument Name: Jasper Riling [id number] Procedure:             Colonoscopy Indications:           Surveillance: Personal history of adenomatous polyps                         on last colonoscopy > 5 years ago Providers:             Andrey Farmer MD, MD Referring MD:          Ocie Cornfield. Ouida Sills MD, MD (Referring MD) Medicines:             Monitored Anesthesia Care Complications:         No immediate complications. Estimated blood loss:                         Minimal. Procedure:             Pre-Anesthesia Assessment:                        - Prior to the procedure, a History and Physical was                         performed, and patient medications and allergies were                         reviewed. The patient is competent. The risks and                         benefits of the procedure and the sedation options and                         risks were discussed with the patient. All questions                         were answered and informed consent was obtained.                         Patient identification and proposed procedure were                         verified by the physician, the nurse, the anesthetist                         and the technician in the endoscopy suite. Mental                         Status Examination: alert and oriented. Airway                         Examination: normal oropharyngeal airway and neck                         mobility. Respiratory Examination: clear to  auscultation. CV Examination: normal. Prophylactic                         Antibiotics: The patient does not require prophylactic                         antibiotics. Prior Anticoagulants: The patient has                          taken no previous anticoagulant or antiplatelet                         agents. ASA Grade Assessment: II - A patient with mild                         systemic disease. After reviewing the risks and                         benefits, the patient was deemed in satisfactory                         condition to undergo the procedure. The anesthesia                         plan was to use monitored anesthesia care (MAC).                         Immediately prior to administration of medications,                         the patient was re-assessed for adequacy to receive                         sedatives. The heart rate, respiratory rate, oxygen                         saturations, blood pressure, adequacy of pulmonary                         ventilation, and response to care were monitored                         throughout the procedure. The physical status of the                         patient was re-assessed after the procedure.                        After obtaining informed consent, the colonoscope was                         passed under direct vision. Throughout the procedure,                         the patient's blood pressure, pulse, and oxygen                         saturations were monitored continuously. The  Colonoscope was introduced through the anus and                         advanced to the the cecum, identified by appendiceal                         orifice and ileocecal valve. The colonoscopy was                         performed without difficulty. The patient tolerated                         the procedure well. The quality of the bowel                         preparation was good. Findings:      The perianal and digital rectal examinations were normal.      A 2 mm polyp was found in the ascending colon. The polyp was sessile.       The polyp was removed with a jumbo cold forceps. Resection and retrieval       were  complete. Estimated blood loss was minimal.      Two sessile polyps were found in the transverse colon. The polyps were 1       to 2 mm in size. These polyps were removed with a jumbo cold forceps.       Resection and retrieval were complete. Estimated blood loss was minimal.      A 6 mm polyp was found in the descending colon. The polyp was sessile.       The polyp was removed with a cold snare. Resection and retrieval were       complete. Estimated blood loss was minimal.      A 1 mm polyp was found in the descending colon. The polyp was sessile.       The polyp was removed with a jumbo cold forceps. Resection and retrieval       were complete. Estimated blood loss was minimal.      Internal hemorrhoids were found during retroflexion. The hemorrhoids       were Grade I (internal hemorrhoids that do not prolapse).      The exam was otherwise without abnormality on direct and retroflexion       views. Impression:            - One 2 mm polyp in the ascending colon, removed with                         a jumbo cold forceps. Resected and retrieved.                        - Two 1 to 2 mm polyps in the transverse colon,                         removed with a jumbo cold forceps. Resected and                         retrieved.                        - One 6 mm polyp  in the descending colon, removed with                         a cold snare. Resected and retrieved.                        - One 1 mm polyp in the descending colon, removed with                         a jumbo cold forceps. Resected and retrieved.                        - Internal hemorrhoids.                        - The examination was otherwise normal on direct and                         retroflexion views. Recommendation:        - Discharge patient to home.                        - Resume previous diet.                        - Continue present medications.                        - Await pathology results.                         - Repeat colonoscopy in 3 years for surveillance.                        - Return to referring physician as previously                         scheduled. Procedure Code(s):     --- Professional ---                        [phone], Colonoscopy, flexible; with removal of                         tumor(s), polyp(s), or other lesion(s) by snare                         technique                        [id number], 60, Colonoscopy, flexible; with biopsy, single                         or multiple Diagnosis Code(s):     --- Professional ---                        K63.5, Polyp of colon                        Z86.010, Personal history of colonic polyps  K64.0, First degree hemorrhoids CPT copyright 2019 [employer]. All rights reserved. The codes documented in this report are preliminary and upon coder review may  be revised to meet current compliance requirements. Andrey Farmer MD, MD 06/28/2021 8:59:43 AM Number of Addenda: 0 Note Initiated On: 06/28/2021 8:17 AM Scope Withdrawal Time: 0 hours 13 minutes 44 seconds  Total Procedure Duration: 0 hours 21 minutes 26 seconds  Estimated Blood Loss:  Estimated blood loss was minimal.      [facility]

## 2021-06-28 NOTE — Interval H&P Note (Signed)
History and Physical Interval Note:  06/28/2021 8:22 AM  [name]  has presented today for surgery, with the diagnosis of personal history colon polyps.  The various methods of treatment have been discussed with the patient and family. After consideration of risks, benefits and other options for treatment, the patient has consented to  Procedure(s): COLONOSCOPY WITH PROPOFOL (N/A) as a surgical intervention.  The patient's history has been reviewed, patient examined, no change in status, stable for surgery.  I have reviewed the patient's chart and labs.  Questions were answered to the patient's satisfaction.     Martin Church  Ok to proceed with colonoscopy

## 2021-07-01 ENCOUNTER — Encounter: Payer: Self-pay | Admitting: Gastroenterology

## 2021-07-01 LAB — SURGICAL PATHOLOGY

## 2021-07-02 DIAGNOSIS — Z79899 Other long term (current) drug therapy: Secondary | ICD-10-CM | POA: Diagnosis not present

## 2021-07-02 DIAGNOSIS — N5201 Erectile dysfunction due to arterial insufficiency: Secondary | ICD-10-CM | POA: Diagnosis not present

## 2021-07-02 DIAGNOSIS — E291 Testicular hypofunction: Secondary | ICD-10-CM | POA: Diagnosis not present

## 2021-07-02 DIAGNOSIS — N401 Enlarged prostate with lower urinary tract symptoms: Secondary | ICD-10-CM | POA: Diagnosis not present

## 2021-12-05 DIAGNOSIS — Z79899 Other long term (current) drug therapy: Secondary | ICD-10-CM | POA: Diagnosis not present

## 2021-12-05 DIAGNOSIS — E291 Testicular hypofunction: Secondary | ICD-10-CM | POA: Diagnosis not present

## 2021-12-05 DIAGNOSIS — N401 Enlarged prostate with lower urinary tract symptoms: Secondary | ICD-10-CM | POA: Diagnosis not present

## 2021-12-17 DIAGNOSIS — N5201 Erectile dysfunction due to arterial insufficiency: Secondary | ICD-10-CM | POA: Diagnosis not present

## 2021-12-17 DIAGNOSIS — E291 Testicular hypofunction: Secondary | ICD-10-CM | POA: Diagnosis not present

## 2021-12-17 DIAGNOSIS — Z79899 Other long term (current) drug therapy: Secondary | ICD-10-CM | POA: Diagnosis not present

## 2021-12-24 ENCOUNTER — Other Ambulatory Visit (HOSPITAL_COMMUNITY): Payer: Self-pay

## 2021-12-26 ENCOUNTER — Other Ambulatory Visit: Payer: Self-pay

## 2021-12-26 DIAGNOSIS — Z0000 Encounter for general adult medical examination without abnormal findings: Secondary | ICD-10-CM | POA: Diagnosis not present

## 2021-12-26 DIAGNOSIS — N1831 Chronic kidney disease, stage 3a: Secondary | ICD-10-CM | POA: Diagnosis not present

## 2021-12-26 DIAGNOSIS — Z13220 Encounter for screening for lipoid disorders: Secondary | ICD-10-CM | POA: Diagnosis not present

## 2021-12-26 DIAGNOSIS — R7303 Prediabetes: Secondary | ICD-10-CM | POA: Diagnosis not present

## 2021-12-26 DIAGNOSIS — Z125 Encounter for screening for malignant neoplasm of prostate: Secondary | ICD-10-CM | POA: Diagnosis not present

## 2021-12-26 MED ORDER — LOSARTAN POTASSIUM-HCTZ 100-12.5 MG PO TABS
1.0000 | ORAL_TABLET | Freq: Every day | ORAL | 11 refills | Status: DC
  Filled 2021-12-26: qty 90, 90d supply, fill #0

## 2022-01-28 DIAGNOSIS — M9905 Segmental and somatic dysfunction of pelvic region: Secondary | ICD-10-CM | POA: Diagnosis not present

## 2022-01-28 DIAGNOSIS — M545 Low back pain, unspecified: Secondary | ICD-10-CM | POA: Diagnosis not present

## 2022-01-28 DIAGNOSIS — M9903 Segmental and somatic dysfunction of lumbar region: Secondary | ICD-10-CM | POA: Diagnosis not present

## 2022-01-28 DIAGNOSIS — M9904 Segmental and somatic dysfunction of sacral region: Secondary | ICD-10-CM | POA: Diagnosis not present

## 2022-01-29 DIAGNOSIS — M9904 Segmental and somatic dysfunction of sacral region: Secondary | ICD-10-CM | POA: Diagnosis not present

## 2022-01-29 DIAGNOSIS — M545 Low back pain, unspecified: Secondary | ICD-10-CM | POA: Diagnosis not present

## 2022-01-29 DIAGNOSIS — M9903 Segmental and somatic dysfunction of lumbar region: Secondary | ICD-10-CM | POA: Diagnosis not present

## 2022-01-29 DIAGNOSIS — M9905 Segmental and somatic dysfunction of pelvic region: Secondary | ICD-10-CM | POA: Diagnosis not present

## 2022-02-03 DIAGNOSIS — M545 Low back pain, unspecified: Secondary | ICD-10-CM | POA: Diagnosis not present

## 2022-02-03 DIAGNOSIS — M9903 Segmental and somatic dysfunction of lumbar region: Secondary | ICD-10-CM | POA: Diagnosis not present

## 2022-02-03 DIAGNOSIS — M9905 Segmental and somatic dysfunction of pelvic region: Secondary | ICD-10-CM | POA: Diagnosis not present

## 2022-02-03 DIAGNOSIS — M9904 Segmental and somatic dysfunction of sacral region: Secondary | ICD-10-CM | POA: Diagnosis not present

## 2022-02-05 DIAGNOSIS — M9904 Segmental and somatic dysfunction of sacral region: Secondary | ICD-10-CM | POA: Diagnosis not present

## 2022-02-05 DIAGNOSIS — M545 Low back pain, unspecified: Secondary | ICD-10-CM | POA: Diagnosis not present

## 2022-02-05 DIAGNOSIS — M9905 Segmental and somatic dysfunction of pelvic region: Secondary | ICD-10-CM | POA: Diagnosis not present

## 2022-02-05 DIAGNOSIS — M9903 Segmental and somatic dysfunction of lumbar region: Secondary | ICD-10-CM | POA: Diagnosis not present

## 2022-02-25 DIAGNOSIS — Z79899 Other long term (current) drug therapy: Secondary | ICD-10-CM | POA: Diagnosis not present

## 2022-02-25 DIAGNOSIS — N5201 Erectile dysfunction due to arterial insufficiency: Secondary | ICD-10-CM | POA: Diagnosis not present

## 2022-02-25 DIAGNOSIS — E291 Testicular hypofunction: Secondary | ICD-10-CM | POA: Diagnosis not present

## 2022-02-25 DIAGNOSIS — N401 Enlarged prostate with lower urinary tract symptoms: Secondary | ICD-10-CM | POA: Diagnosis not present

## 2022-02-26 DIAGNOSIS — Z79899 Other long term (current) drug therapy: Secondary | ICD-10-CM | POA: Diagnosis not present

## 2022-02-26 DIAGNOSIS — E291 Testicular hypofunction: Secondary | ICD-10-CM | POA: Diagnosis not present

## 2022-02-26 DIAGNOSIS — N401 Enlarged prostate with lower urinary tract symptoms: Secondary | ICD-10-CM | POA: Diagnosis not present

## 2022-02-26 DIAGNOSIS — R861 Abnormal level of hormones in specimens from male genital organs: Secondary | ICD-10-CM | POA: Diagnosis not present

## 2022-02-26 DIAGNOSIS — N5201 Erectile dysfunction due to arterial insufficiency: Secondary | ICD-10-CM | POA: Diagnosis not present

## 2022-02-27 ENCOUNTER — Other Ambulatory Visit: Payer: Self-pay

## 2022-02-27 MED ORDER — ANASTROZOLE 1 MG PO TABS
ORAL_TABLET | ORAL | 3 refills | Status: DC
  Filled 2022-02-27: qty 30, 30d supply, fill #0

## 2022-02-28 ENCOUNTER — Ambulatory Visit
Admission: RE | Admit: 2022-02-28 | Discharge: 2022-02-28 | Disposition: A | Discharge status: Home / Self Care | Payer: 59 | Source: Ambulatory Visit | Attending: Urology | Admitting: Urology

## 2022-02-28 DIAGNOSIS — D751 Secondary polycythemia: Secondary | ICD-10-CM | POA: Insufficient documentation

## 2022-03-07 ENCOUNTER — Ambulatory Visit
Admission: RE | Admit: 2022-03-07 | Discharge: 2022-03-07 | Disposition: A | Discharge status: Home / Self Care | Payer: 59 | Source: Ambulatory Visit | Attending: Urology | Admitting: Urology

## 2022-03-07 VITALS — BP 135/94 | HR 90 | Temp 97.9°F | Resp 16

## 2022-03-07 DIAGNOSIS — D582 Other hemoglobinopathies: Secondary | ICD-10-CM | POA: Insufficient documentation

## 2022-03-07 LAB — CBC
HCT: 48.5 % (ref 39.0–52.0)
Hemoglobin: 15.9 g/dL (ref 13.0–17.0)
MCH: 28.4 pg (ref 26.0–34.0)
MCHC: 32.8 g/dL (ref 30.0–36.0)
MCV: 86.6 fL (ref 80.0–100.0)
Platelets: 196 10*3/uL (ref 150–400)
RBC: 5.6 MIL/uL (ref 4.22–5.81)
RDW: 13 % (ref 11.5–15.5)
WBC: 4.2 10*3/uL (ref 4.0–10.5)
nRBC: 0 % (ref 0.0–0.2)

## 2022-04-29 DIAGNOSIS — N183 Chronic kidney disease, stage 3 unspecified: Secondary | ICD-10-CM | POA: Diagnosis not present

## 2022-04-29 DIAGNOSIS — Z23 Encounter for immunization: Secondary | ICD-10-CM | POA: Diagnosis not present

## 2022-04-29 DIAGNOSIS — H9319 Tinnitus, unspecified ear: Secondary | ICD-10-CM | POA: Diagnosis not present

## 2022-04-29 DIAGNOSIS — E291 Testicular hypofunction: Secondary | ICD-10-CM | POA: Diagnosis not present

## 2022-04-29 DIAGNOSIS — I129 Hypertensive chronic kidney disease with stage 1 through stage 4 chronic kidney disease, or unspecified chronic kidney disease: Secondary | ICD-10-CM | POA: Diagnosis not present

## 2022-04-29 DIAGNOSIS — R7303 Prediabetes: Secondary | ICD-10-CM | POA: Diagnosis not present

## 2022-04-29 DIAGNOSIS — M25562 Pain in left knee: Secondary | ICD-10-CM | POA: Diagnosis not present

## 2022-05-14 DIAGNOSIS — N5201 Erectile dysfunction due to arterial insufficiency: Secondary | ICD-10-CM | POA: Diagnosis not present

## 2022-05-14 DIAGNOSIS — E291 Testicular hypofunction: Secondary | ICD-10-CM | POA: Diagnosis not present

## 2022-05-14 DIAGNOSIS — Z79899 Other long term (current) drug therapy: Secondary | ICD-10-CM | POA: Diagnosis not present

## 2022-05-30 DIAGNOSIS — I10 Essential (primary) hypertension: Secondary | ICD-10-CM | POA: Diagnosis not present

## 2022-06-10 DIAGNOSIS — M160 Bilateral primary osteoarthritis of hip: Secondary | ICD-10-CM | POA: Diagnosis not present

## 2022-06-10 DIAGNOSIS — M25562 Pain in left knee: Secondary | ICD-10-CM | POA: Diagnosis not present

## 2022-06-10 DIAGNOSIS — M1612 Unilateral primary osteoarthritis, left hip: Secondary | ICD-10-CM | POA: Diagnosis not present

## 2022-06-17 DIAGNOSIS — M76892 Other specified enthesopathies of left lower limb, excluding foot: Secondary | ICD-10-CM | POA: Diagnosis not present

## 2022-06-17 DIAGNOSIS — W19XXXA Unspecified fall, initial encounter: Secondary | ICD-10-CM | POA: Diagnosis not present

## 2022-06-17 DIAGNOSIS — M948X6 Other specified disorders of cartilage, lower leg: Secondary | ICD-10-CM | POA: Diagnosis not present

## 2022-06-17 DIAGNOSIS — S83282A Other tear of lateral meniscus, current injury, left knee, initial encounter: Secondary | ICD-10-CM | POA: Diagnosis not present

## 2022-06-27 ENCOUNTER — Other Ambulatory Visit: Payer: Self-pay

## 2022-06-27 DIAGNOSIS — Z2821 Immunization not carried out because of patient refusal: Secondary | ICD-10-CM | POA: Diagnosis not present

## 2022-06-27 DIAGNOSIS — E118 Type 2 diabetes mellitus with unspecified complications: Secondary | ICD-10-CM | POA: Diagnosis not present

## 2022-06-27 DIAGNOSIS — N183 Chronic kidney disease, stage 3 unspecified: Secondary | ICD-10-CM | POA: Diagnosis not present

## 2022-06-27 DIAGNOSIS — N1831 Chronic kidney disease, stage 3a: Secondary | ICD-10-CM | POA: Diagnosis not present

## 2022-06-27 DIAGNOSIS — I129 Hypertensive chronic kidney disease with stage 1 through stage 4 chronic kidney disease, or unspecified chronic kidney disease: Secondary | ICD-10-CM | POA: Diagnosis not present

## 2022-06-27 MED ORDER — AMLODIPINE BESYLATE 5 MG PO TABS
5.0000 mg | ORAL_TABLET | Freq: Every day | ORAL | 3 refills | Status: DC
  Filled 2022-06-27: qty 90, 90d supply, fill #0

## 2022-06-29 ENCOUNTER — Other Ambulatory Visit: Payer: Self-pay

## 2022-06-29 MED ORDER — ROSUVASTATIN CALCIUM 20 MG PO TABS
ORAL_TABLET | ORAL | 3 refills | Status: DC
  Filled 2022-06-29: qty 90, 90d supply, fill #0

## 2022-06-30 ENCOUNTER — Other Ambulatory Visit: Payer: Self-pay

## 2022-06-30 DIAGNOSIS — M1712 Unilateral primary osteoarthritis, left knee: Secondary | ICD-10-CM | POA: Diagnosis not present

## 2022-07-14 ENCOUNTER — Other Ambulatory Visit: Payer: Self-pay

## 2022-07-14 MED ORDER — CHLORHEXIDINE GLUCONATE 0.12 % MT SOLN
Freq: Two times a day (BID) | OROMUCOSAL | 12 refills | Status: DC
  Filled 2022-07-14: qty 473, 15d supply, fill #0

## 2022-07-14 MED ORDER — AMOXICILLIN 500 MG PO CAPS
500.0000 mg | ORAL_CAPSULE | Freq: Four times a day (QID) | ORAL | 0 refills | Status: DC
  Filled 2022-07-14: qty 40, 10d supply, fill #0

## 2022-08-14 DIAGNOSIS — I2510 Atherosclerotic heart disease of native coronary artery without angina pectoris: Secondary | ICD-10-CM | POA: Diagnosis not present

## 2022-08-14 DIAGNOSIS — E785 Hyperlipidemia, unspecified: Secondary | ICD-10-CM | POA: Diagnosis not present

## 2022-08-14 DIAGNOSIS — I10 Essential (primary) hypertension: Secondary | ICD-10-CM | POA: Diagnosis not present

## 2022-08-14 DIAGNOSIS — K5900 Constipation, unspecified: Secondary | ICD-10-CM | POA: Diagnosis not present

## 2022-09-09 ENCOUNTER — Other Ambulatory Visit: Payer: Self-pay

## 2022-09-09 MED ORDER — SILDENAFIL CITRATE 20 MG PO TABS
40.0000 mg | ORAL_TABLET | Freq: Every day | ORAL | 5 refills | Status: DC | PRN
  Filled 2022-09-09: qty 20, 4d supply, fill #0
  Filled 2022-10-14: qty 20, 4d supply, fill #1
  Filled 2022-12-09: qty 20, 4d supply, fill #2
  Filled 2023-01-21: qty 20, 4d supply, fill #3
  Filled 2023-02-19: qty 20, 4d supply, fill #4

## 2022-10-14 ENCOUNTER — Other Ambulatory Visit: Payer: Self-pay

## 2022-10-15 ENCOUNTER — Ambulatory Visit: Payer: Commercial Managed Care - PPO | Admitting: Urology

## 2022-10-15 ENCOUNTER — Encounter: Payer: Self-pay | Admitting: Urology

## 2022-10-15 VITALS — BP 150/80 | HR 90 | Ht 70.0 in | Wt 237.0 lb

## 2022-10-15 DIAGNOSIS — E291 Testicular hypofunction: Secondary | ICD-10-CM

## 2022-10-16 ENCOUNTER — Telehealth: Payer: Self-pay | Admitting: *Deleted

## 2022-10-16 ENCOUNTER — Encounter: Payer: Self-pay | Admitting: Urology

## 2022-10-16 LAB — TESTOSTERONE: Testosterone: 349 ng/dL (ref 264–916)

## 2022-10-16 NOTE — Telephone Encounter (Signed)
Notified patient as instructed, patient will keep up record Dr. Letta Kocher he did pay to get them sent over.

## 2022-10-16 NOTE — Telephone Encounter (Signed)
-----   Message from Abbie Sons, MD sent at 10/16/2022  7:21 AM EST ----- Testosterone level low normal at 349.  Will await Dr. Letta Kocher records

## 2022-10-16 NOTE — Progress Notes (Signed)
10/15/2022 9:17 PM   Martin Church [birth year] [id number]  Referring provider: Kirk Ruths, MD [facility] [place],  [place] [address]  Chief Complaint  Patient presents with   Hypogonadism    HPI: Martin Church is a 67 y.o. male for testosterone replacement therapy.  Former patient of Dr. Yves Dill who recently retired.  When the referral was placed this information was not provided and his records were not obtained for review.  The history was obtained from the patient States he has been followed by Dr. Yves Dill for hypogonadism for the past 8+ years Has had subcutaneous testosterone pellets placed.  He states he was not going through her insurance and pain Dr. Yves Dill $700 twice yearly to implant fifteen 100 mg pellets He states his last implantation was mid September 2023   PMH: Past Medical History:  Diagnosis Date   ED (erectile dysfunction)    HLD (hyperlipidemia)    Vitamin D deficiency     Surgical History: Past Surgical History:  Procedure Laterality Date   APPENDECTOMY     COLONOSCOPY     COLONOSCOPY WITH PROPOFOL N/A 06/28/2021   Procedure: COLONOSCOPY WITH PROPOFOL;  Surgeon: Lesly Rubenstein, MD;  Location: [facility] ENDOSCOPY;  Service: Endoscopy;  Laterality: N/A;    Home Medications:  Allergies as of 10/15/2022   No Known Allergies      Medication List        Accurate as of October 15, 2022 11:59 PM. If you have any questions, ask your nurse or doctor.          amLODipine 5 MG tablet Commonly known as: NORVASC Take 1 tablet (5 mg total) by mouth daily.   amoxicillin 500 MG capsule Commonly known as: AMOXIL Take 1 capsule (500 mg total) by mouth 4 (four) times daily until all are taken. Beginning the morning of surgery   anastrozole 1 MG tablet Commonly known as: ARIMIDEX TAKE 1/2 TABLET BY MOUTH EVERY DAY (take half tablet (0.5 mg) by oral route every other day)   Bee Pollen 1000 MG Tabs Take 1  tablet by mouth.   chlorhexidine 0.12 % solution Commonly known as: Peridex Rinse with 1/2 oz. by mouth and expectorate twice a day   ferrous sulfate 324 MG Tbec Take 324 mg by mouth daily with breakfast.   HYDROcodone-acetaminophen 5-325 MG tablet Commonly known as: NORCO/VICODIN Take 1 tablet by mouth every 6 (six) hours as needed for moderate pain or severe pain.   ibuprofen 800 MG tablet Commonly known as: ADVIL Take 800 mg by mouth every 8 (eight) hours as needed.   losartan-hydrochlorothiazide 100-12.5 MG tablet Commonly known as: HYZAAR Take 1 tablet by mouth once daily   multivitamin tablet Take 1 tablet by mouth daily.   polyethylene glycol-electrolytes 420 g solution Commonly known as: NuLYTELY Take 4,000 mLs by mouth as directed Split Colon Prep.   rosuvastatin 20 MG tablet Commonly known as: CRESTOR Take 1 tablet (20 mg total) by mouth once daily   sildenafil 20 MG tablet Commonly known as: REVATIO Take 20 mg by mouth daily as needed. 2-5 tablets   sildenafil 20 MG tablet Commonly known as: REVATIO Take 2-5 tablets (40-100 mg total) by mouth once daily as needed   vitamin E 180 MG (400 UNITS) capsule Take 400 Units by mouth daily.        Allergies: No Known Allergies  Family History: No family history on file.  Social History:  reports that he has never smoked. He has never used smokeless tobacco. He reports current alcohol use. He reports that he does not use drugs.   Physical Exam: BP (!) 150/80   Pulse 90   Ht 5' 10"$  (1.778 m)   Wt 237 lb (107.5 kg)   BMI 34.01 kg/m   Constitutional:  Alert and oriented, No acute distress. HEENT: [place] AT Respiratory: Normal respiratory effort, no increased work of breathing. Psychiatric: Normal mood and affect.   Assessment & Plan:    1. Hypogonadism in male Obtain records for review He was informed we use Testopel in our office and not a compounded testosterone pellet which will be more expensive  if he does not utilize insurance Will contact once records are reviewed   Abbie Sons, [place] [address], [place], [place] [address] [phone]

## 2022-11-12 NOTE — Telephone Encounter (Signed)
Talked with patient and he states he would like his records back after Dr. Bernardo Heater goes through them.

## 2022-11-12 NOTE — Telephone Encounter (Signed)
Patient came by the office and said he still hasn't gotten the CD they were supposed to send for records but that he did have some previous Dr Yves Dill records that he stopped by and dropped off. He would like a call back at  [phone]. He would like to pick them back up after we scan into chart. He would also like a call back to discuss recent results. Also based on the form patients signed and paid for records to be sent, It said that the CD will be sent within 30 business days and the form was done on 10/15/22 so it is still within that time frame. Patient did say once we receive the CD that after we extract he would like to keep the CD for his own records

## 2022-11-24 ENCOUNTER — Telehealth: Payer: Self-pay | Admitting: Urology

## 2022-11-24 NOTE — Telephone Encounter (Signed)
Patient needs to be scheduled  °

## 2022-11-24 NOTE — Telephone Encounter (Signed)
Patient called and stated that Dr. Bernardo Heater has received his records from Dr. Yves Dill, and he is requesting to have his testoterone labs done and discuss testosterone pellets.

## 2022-11-26 ENCOUNTER — Other Ambulatory Visit: Payer: 59

## 2022-11-26 DIAGNOSIS — E291 Testicular hypofunction: Secondary | ICD-10-CM

## 2022-11-27 LAB — TESTOSTERONE: Testosterone: 197 ng/dL — ABNORMAL LOW (ref 264–916)

## 2022-12-05 ENCOUNTER — Telehealth: Payer: Self-pay | Admitting: Family Medicine

## 2022-12-05 NOTE — Telephone Encounter (Signed)
Patient called and states he is wanting to have Testopel. I see his last testosterone level is low. I only have 1 low Testosterone in the chart. He may need another testosterone blood draw to get approval. Do you want him to have an appointment?

## 2022-12-09 ENCOUNTER — Other Ambulatory Visit: Payer: Self-pay

## 2022-12-10 ENCOUNTER — Other Ambulatory Visit: Payer: Self-pay | Admitting: *Deleted

## 2022-12-10 DIAGNOSIS — E291 Testicular hypofunction: Secondary | ICD-10-CM

## 2022-12-11 ENCOUNTER — Other Ambulatory Visit: Payer: 59

## 2022-12-11 DIAGNOSIS — E291 Testicular hypofunction: Secondary | ICD-10-CM

## 2022-12-12 LAB — TESTOSTERONE: Testosterone: 120 ng/dL — ABNORMAL LOW (ref 264–916)

## 2022-12-12 NOTE — Telephone Encounter (Signed)
-----   Message from Riki Altes, MD sent at 12/12/2022  7:15 AM EDT ----- Repeat testosterone level low at 120.  If he desires to proceed with Testopel can proceed with prior authorization and if approved schedule with Carollee Herter

## 2022-12-12 NOTE — Telephone Encounter (Signed)
Notified patient as instructed, patient would like to do the testopel. Sending to carrie

## 2022-12-12 NOTE — Telephone Encounter (Signed)
NO PA required, appointment scheduled.

## 2022-12-22 NOTE — Progress Notes (Unsigned)
He presents today for Testopel insertion.  Patient is placed on the exam table in the *** lateral jackknife position.  Identified upper outer quadrant of hip for insertion; prepped area with *** and injected *** cc's of Lidocaine 1% with Epinephrine to anesthetize superficially and distally along trocar tract.  Made 3 mm incision using 11 blade of scalpel; trocar with sharp ended stylet was inserted into subcutaneous tissue in line with femur. Sharp stylet was withdrawn and *** pellets were placed into trocar well. Testopel pellets advanced into tissue using blunt ended stylet. Trocar removed and incision closed using *** Steri-Strips. Cleansed area to remove *** and covered Steri-Strips with outer Band-Aid.  Careful inspection of insertion is done and patient informed of post procedure instructions.  Advised patient to apply ice to the site for 20-30 minutes every hour if needed.  Avoid hot tubes, swimming or full water immersion of the insertion site for 72 hours.  Bandage may be removed after one week.    Patient is advised to contact the office if experiencing drainage of the insertion site, excessive redness or swelling of the site, chills and/or fevers > 101.5, nausea or vomiting, dizziness or lightheadedness and excessive tenderness.  Avoid strenuous activity and heavy lifting for 72 hours.     He will return in *** month for serum testosterone.

## 2022-12-23 ENCOUNTER — Ambulatory Visit: Payer: 59 | Admitting: Urology

## 2022-12-23 ENCOUNTER — Encounter: Payer: Self-pay | Admitting: Urology

## 2022-12-23 VITALS — BP 163/90 | HR 97 | Ht 74.0 in | Wt 232.0 lb

## 2022-12-23 DIAGNOSIS — E291 Testicular hypofunction: Secondary | ICD-10-CM | POA: Diagnosis not present

## 2023-01-02 DIAGNOSIS — I129 Hypertensive chronic kidney disease with stage 1 through stage 4 chronic kidney disease, or unspecified chronic kidney disease: Secondary | ICD-10-CM | POA: Diagnosis not present

## 2023-01-02 DIAGNOSIS — E291 Testicular hypofunction: Secondary | ICD-10-CM | POA: Diagnosis not present

## 2023-01-02 DIAGNOSIS — Z0000 Encounter for general adult medical examination without abnormal findings: Secondary | ICD-10-CM | POA: Diagnosis not present

## 2023-01-02 DIAGNOSIS — N183 Chronic kidney disease, stage 3 unspecified: Secondary | ICD-10-CM | POA: Diagnosis not present

## 2023-01-02 DIAGNOSIS — E7800 Pure hypercholesterolemia, unspecified: Secondary | ICD-10-CM | POA: Diagnosis not present

## 2023-01-02 DIAGNOSIS — E118 Type 2 diabetes mellitus with unspecified complications: Secondary | ICD-10-CM | POA: Diagnosis not present

## 2023-01-05 DIAGNOSIS — E291 Testicular hypofunction: Secondary | ICD-10-CM | POA: Diagnosis not present

## 2023-01-05 MED ORDER — TESTOSTERONE 75 MG IL PLLT
75.0000 mg | PELLET | Freq: Once | Status: AC
  Administered 2023-01-05: 75 mg

## 2023-01-05 NOTE — Addendum Note (Signed)
Addended by: Levada Schilling on: 01/05/2023 09:19 AM   Modules accepted: Orders

## 2023-01-20 DIAGNOSIS — N189 Chronic kidney disease, unspecified: Secondary | ICD-10-CM | POA: Diagnosis not present

## 2023-01-20 DIAGNOSIS — I129 Hypertensive chronic kidney disease with stage 1 through stage 4 chronic kidney disease, or unspecified chronic kidney disease: Secondary | ICD-10-CM | POA: Diagnosis not present

## 2023-01-20 DIAGNOSIS — R7303 Prediabetes: Secondary | ICD-10-CM | POA: Diagnosis not present

## 2023-01-21 ENCOUNTER — Other Ambulatory Visit: Payer: Self-pay

## 2023-01-21 ENCOUNTER — Other Ambulatory Visit: Payer: 59

## 2023-01-21 DIAGNOSIS — E291 Testicular hypofunction: Secondary | ICD-10-CM | POA: Diagnosis not present

## 2023-01-22 ENCOUNTER — Other Ambulatory Visit: Payer: Self-pay | Admitting: Family Medicine

## 2023-01-22 DIAGNOSIS — Z125 Encounter for screening for malignant neoplasm of prostate: Secondary | ICD-10-CM

## 2023-01-22 DIAGNOSIS — E291 Testicular hypofunction: Secondary | ICD-10-CM

## 2023-01-22 LAB — HEMOGLOBIN: Hemoglobin: 15.4 g/dL (ref 13.0–17.7)

## 2023-01-22 LAB — TESTOSTERONE: Testosterone: 665 ng/dL (ref 264–916)

## 2023-01-22 LAB — HEMATOCRIT: Hematocrit: 45.2 % (ref 37.5–51.0)

## 2023-02-02 ENCOUNTER — Telehealth: Payer: Self-pay

## 2023-02-02 NOTE — Telephone Encounter (Signed)
Pt called in on triage line requesting text link for My Chart sign up to be sent.  Link sent to pt.

## 2023-02-05 NOTE — Telephone Encounter (Signed)
Entered in error

## 2023-02-19 ENCOUNTER — Other Ambulatory Visit: Payer: Self-pay

## 2023-03-20 ENCOUNTER — Other Ambulatory Visit: Payer: 59

## 2023-03-20 DIAGNOSIS — E291 Testicular hypofunction: Secondary | ICD-10-CM | POA: Diagnosis not present

## 2023-03-20 DIAGNOSIS — Z125 Encounter for screening for malignant neoplasm of prostate: Secondary | ICD-10-CM

## 2023-03-21 LAB — HEMOGLOBIN: Hemoglobin: 15.9 g/dL (ref 13.0–17.7)

## 2023-03-21 LAB — TESTOSTERONE: Testosterone: 456 ng/dL (ref 264–916)

## 2023-03-21 LAB — PSA: Prostate Specific Ag, Serum: 0.8 ng/mL (ref 0.0–4.0)

## 2023-03-21 LAB — HEMATOCRIT: Hematocrit: 48.3 % (ref 37.5–51.0)

## 2023-03-25 NOTE — Progress Notes (Unsigned)
03/26/2023 9:27 AM   Martin Church 11/10/1955 [id number]  Referring provider: Lauro Regulus, MD [address] [facility] [place],  [place] [address]  Urological history: 1.  Erectile dysfunction -Contributing factors of age, BPH, hypogonadism, hyperlipidemia, diabetes, CKD and alcohol consumption -Sildenafil 20 mg, on-demand dosing  2. BPH with LU TS -PSA (03/2023) 0.8  3.  Hypogonadism -Contributing factors of age, diabetes and CKD.   -Testosterone level (03/2023) 456 -Hemoglobin/hematocrit (03/2023) 15.9/48.3 -Testopel-last insertion April 2024  Chief Complaint  Patient presents with   Hypogonadism     HPI: Martin Church is a 67 y.o. male who presents today for follow up.   Previous records reviewed.   He is doing okay with the Testopel.  He states that when he was receiving the 15 pellets at 1 time with Dr. Terrance Mass office he felt better, but this current dose is acceptable for him.  I PSS 9/1  He has no urinary complaints at this time.  Patient denies any modifying or aggravating factors.  Patient denies any recent UTI's, gross hematuria, dysuria or suprapubic/flank pain.  Patient denies any fevers, chills, nausea or vomiting.     IPSS     Row Name 03/26/23 0900         International Prostate Symptom Score   How often have you had the sensation of not emptying your bladder? About half the time     How often have you had to urinate less than every two hours? Less than 1 in 5 times     How often have you found you stopped and started again several times when you urinated? About half the time     How often have you found it difficult to postpone urination? Less than 1 in 5 times     How often have you had a weak urinary stream? Not at All     How often have you had to strain to start urination? Not at All     How many times did you typically get up at night to urinate? 1 Time     Total IPSS Score 9       Quality of Life due to  urinary symptoms   If you were to spend the rest of your life with your urinary condition just the way it is now how would you feel about that? Pleased             Score:  1-7 Mild 8-19 Moderate 20-35 Severe   SHIM 15  Patient still having spontaneous erections.  He denies any pain or curvature with erections.    He is taking tadalafil 20 mg on-demand dosing, he typically takes 2-1/2 tablets before intercourse.   SHIM     Row Name 03/26/23 0905         SHIM: Over the last 6 months:   How do you rate your confidence that you could get and keep an erection? Low     When you had erections with sexual stimulation, how often were your erections hard enough for penetration (entering your partner)? Sometimes (about half the time)     During sexual intercourse, how often were you able to maintain your erection after you had penetrated (entered) your partner? Sometimes (about half the time)     During sexual intercourse, how difficult was it to maintain your erection to completion of intercourse? Slightly Difficult     When you attempted sexual intercourse, how often  was it satisfactory for you? Sometimes (about half the time)       SHIM Total Score   SHIM 15             Score: 1-7 Severe ED 8-11 Moderate ED 12-16 Mild-Moderate ED 17-21 Mild ED 22-25 No ED   PMH: Past Medical History:  Diagnosis Date   ED (erectile dysfunction)    HLD (hyperlipidemia)    Vitamin D deficiency     Surgical History: Past Surgical History:  Procedure Laterality Date   APPENDECTOMY     COLONOSCOPY     COLONOSCOPY WITH PROPOFOL N/A 06/28/2021   Procedure: COLONOSCOPY WITH PROPOFOL;  Surgeon: Regis Bill, MD;  Location: [facility] ENDOSCOPY;  Service: Endoscopy;  Laterality: N/A;    Home Medications:  Allergies as of 03/26/2023   No Known Allergies      Medication List        Accurate as of March 26, 2023  9:27 AM. If you have any questions, ask your nurse or doctor.           amLODipine 5 MG tablet Commonly known as: NORVASC Take 1 tablet (5 mg total) by mouth daily.   Bee Pollen 1000 MG Tabs Take 1 tablet by mouth.   ferrous sulfate 324 MG Tbec Take 324 mg by mouth daily with breakfast.   ibuprofen 800 MG tablet Commonly known as: ADVIL Take 800 mg by mouth every 8 (eight) hours as needed.   multivitamin tablet Take 1 tablet by mouth daily.   rosuvastatin 40 MG tablet Commonly known as: CRESTOR Take 0.5 tablets by mouth daily.   sildenafil 20 MG tablet Commonly known as: REVATIO Take 2-5 tablets (40-100 mg total) by mouth once daily as needed   sildenafil 20 MG tablet Commonly known as: REVATIO Take 1 tablet (20 mg total) by mouth daily as needed. 2-5 tablets   vitamin E 180 MG (400 UNITS) capsule Take 400 Units by mouth daily.        Allergies: No Known Allergies  Family History: No family history on file.  Social History:  reports that he has never smoked. He has never used smokeless tobacco. He reports current alcohol use. He reports that he does not use drugs.  ROS: Pertinent ROS in HPI  Physical Exam: BP (!) 162/118   Pulse 86   Wt 235 lb (106.6 kg)   BMI 30.17 kg/m   Constitutional:  Well nourished. Alert and oriented, No acute distress. HEENT: [place] AT, moist mucus membranes.  Trachea midline Cardiovascular: No clubbing, cyanosis, or edema. Respiratory: Normal respiratory effort, no increased work of breathing. Neurologic: Grossly intact, no focal deficits, moving all 4 extremities. Psychiatric: Normal mood and affect.  Laboratory Data: Results for orders placed or performed in visit on 03/20/23  Hematocrit  Result Value Ref Range   Hematocrit 48.3 37.5 - 51.0 %  Hemoglobin  Result Value Ref Range   Hemoglobin 15.9 13.0 - 17.7 g/dL  PSA  Result Value Ref Range   Prostate Specific Ag, Serum 0.8 0.0 - 4.0 ng/mL  Testosterone  Result Value Ref Range   Testosterone 456 264 - 916 ng/dL    Comprehensive  Metabolic Panel (CMP) Order: [id number] Component Ref Range & Units 2 mo ago Glucose 70 - 110 mg/dL 95 Sodium 875 - 643 mmol/L 140 Potassium 3.6 - 5.1 mmol/L 4.7 Chloride 97 - 109 mmol/L 107 Carbon Dioxide (CO2) 22.0 - 32.0 mmol/L 29.9 Urea Nitrogen (BUN) 7 - 25 mg/dL 20 Creatinine 0.7 -  1.3 mg/dL 1.4 High  Glomerular Filtration Rate (eGFR) >60 mL/min/1.73sq m 56 Low  Comment: CKD-EPI (2021) does not include patient's race in the calculation of eGFR.  Monitoring changes of plasma creatinine and eGFR over time is useful for monitoring kidney function.  Interpretive Ranges for eGFR (CKD-EPI 2021):  eGFR:       >60 mL/min/1.73 sq. m - Normal eGFR:       30-59 mL/min/1.73 sq. m - Moderately Decreased eGFR:       15-29 mL/min/1.73 sq. m  - Severely Decreased eGFR:       < 15 mL/min/1.73 sq. m  - Kidney Failure   Note: These eGFR calculations do not apply in acute situations when eGFR is changing rapidly or patients on dialysis. Calcium 8.7 - 10.3 mg/dL 9.0 AST 8 - 39 U/L 18 ALT 6 - 57 U/L 21 Alk Phos (alkaline Phosphatase) 34 - 104 U/L 49 Albumin 3.5 - 4.8 g/dL 4.3 Bilirubin, Total 0.3 - 1.2 mg/dL 0.6 Protein, Total 6.1 - 7.9 g/dL 6.8 A/G Ratio 1.0 - 5.0 gm/dL 1.7 Resulting Agency [facility]  Specimen Collected: 01/02/23 10:30 Performed by: [facility] Last Resulted: 01/02/23 15:29 Received From: [facility]  Result Received: 01/05/23 09:09   Hemoglobin A1C Order: [id number] Component Ref Range & Units 2 mo ago Hemoglobin A1C 4.2 - 5.6 % 5.9 High  Average Blood Glucose (Calc) mg/dL 308 Resulting Agency [facility] Narrative Performed by [facility] Normal Range:    4.2 - 5.6% Increased Risk:  5.7 - 6.4% Diabetes:        >= 6.5% Glycemic Control for adults with diabetes:  <7%    Specimen Collected: 01/02/23 10:30 Performed by: [facility]  [facility] Last Resulted: 01/02/23 11:39 Received From: [facility]  Result Received: 01/05/23 09:09  Lipid Panel w/calc LDL Order: [id number] Component Ref Range & Units 2 mo ago Cholesterol, Total 100 - 200 mg/dL 952 Triglyceride 35 - 841 mg/dL 96 HDL (High Density Lipoprotein) Cholesterol 29.0 - 71.0 mg/dL 32.4 LDL Calculated 0 - 130 mg/dL 401 VLDL Cholesterol mg/dL 19 Cholesterol/HDL Ratio 3.9 Resulting Agency [facility]  Specimen Collected: 01/02/23 10:30 Performed by: [facility] Last Resulted: 01/02/23 15:36 Received From: [facility]  Result Received: 01/05/23 09:09 I have reviewed the labs.   Pertinent Imaging: N/A  Assessment & Plan:    1. Testosterone deficiency  -testosterone levels are therapeutic -H & H -WNL -continue Testopel insertion  2. BPH with LUTS -PSA stable -continue conservative management, avoiding bladder irritants and timed voiding's  3. Erectile dysfunction:    -Continue sildenafil 20 mg, on-demand dosing -Prescription sent to [facility] so he can use good Rx coupon     Return for Testopel insertion .  These notes generated with voice recognition software. I apologize for typographical errors.  Cloretta Ned  [facility] [address]  [address] [place], [place] [address] [phone]

## 2023-03-26 ENCOUNTER — Ambulatory Visit: Payer: 59 | Admitting: Urology

## 2023-03-26 ENCOUNTER — Encounter: Payer: Self-pay | Admitting: Urology

## 2023-03-26 VITALS — BP 162/118 | HR 86 | Wt 235.0 lb

## 2023-03-26 DIAGNOSIS — E291 Testicular hypofunction: Secondary | ICD-10-CM

## 2023-03-26 DIAGNOSIS — N529 Male erectile dysfunction, unspecified: Secondary | ICD-10-CM | POA: Diagnosis not present

## 2023-03-26 DIAGNOSIS — N401 Enlarged prostate with lower urinary tract symptoms: Secondary | ICD-10-CM

## 2023-03-26 MED ORDER — SILDENAFIL CITRATE 20 MG PO TABS: 20.0000 mg | ORAL_TABLET | Freq: Every day | ORAL | 3 refills | Status: DC | PRN

## 2023-04-27 NOTE — Progress Notes (Unsigned)
He presents today for Testopel insertion.  Patient is placed on the exam table in the *** lateral jackknife position.  Identified upper outer quadrant of hip for insertion; prepped area with *** and injected *** cc's of Lidocaine 1% with Epinephrine to anesthetize superficially and distally along trocar tract.  Made 3 mm incision using 11 blade of scalpel; trocar with sharp ended stylet was inserted into subcutaneous tissue in line with femur. Sharp stylet was withdrawn and *** pellets were placed into trocar well. Testopel pellets advanced into tissue using blunt ended stylet. Trocar removed and incision closed using *** Steri-Strips. Cleansed area to remove *** and covered Steri-Strips with outer Band-Aid.  Careful inspection of insertion is done and patient informed of post procedure instructions.  Advised patient to apply ice to the site for 20-30 minutes every hour if needed.  Avoid hot tubes, swimming or full water immersion of the insertion site for 72 hours.  Bandage may be removed after one week.    Patient is advised to contact the office if experiencing drainage of the insertion site, excessive redness or swelling of the site, chills and/or fevers > 101.5, nausea or vomiting, dizziness or lightheadedness and excessive tenderness.  Avoid strenuous activity and heavy lifting for 72 hours.     He will return in *** month for serum testosterone, hemoglobin and hematocrit

## 2023-04-28 ENCOUNTER — Ambulatory Visit: Payer: 59 | Admitting: Urology

## 2023-04-28 ENCOUNTER — Encounter: Payer: Self-pay | Admitting: Urology

## 2023-04-28 VITALS — BP 144/95 | HR 82 | Wt 237.0 lb

## 2023-04-28 DIAGNOSIS — E291 Testicular hypofunction: Secondary | ICD-10-CM

## 2023-04-28 MED ORDER — TESTOSTERONE 75 MG IL PLLT
75.0000 mg | PELLET | Freq: Once | Status: AC
  Administered 2023-04-28: 75 mg

## 2023-05-28 NOTE — Progress Notes (Unsigned)
05/29/2023 8:14 PM   Martin Church 13-Apr-1956 [id number]  Referring provider: Lauro Regulus, MD [address] [facility] [place],  [place] [address]  Urological history: 1.  Erectile dysfunction -Contributing factors of age, BPH, hypogonadism, hyperlipidemia, diabetes, CKD and alcohol consumption -Sildenafil 20 mg, on-demand dosing  2. BPH with LU TS -PSA (03/2023) 0.8  3.  Hypogonadism -Contributing factors of age, diabetes and CKD.   -Testosterone level pending -Hemoglobin/hematocrit pending  No chief complaint on file.   HPI: Martin Church is a 67 y.o. male who presents for one month follow up.    Previous records reviewed.    PMH: Past Medical History:  Diagnosis Date   ED (erectile dysfunction)    HLD (hyperlipidemia)    Vitamin D deficiency     Surgical History: Past Surgical History:  Procedure Laterality Date   APPENDECTOMY     COLONOSCOPY     COLONOSCOPY WITH PROPOFOL N/A 06/28/2021   Procedure: COLONOSCOPY WITH PROPOFOL;  Surgeon: Regis Bill, MD;  Location: [facility] ENDOSCOPY;  Service: Endoscopy;  Laterality: N/A;    Home Medications:  Allergies as of 05/29/2023   No Known Allergies      Medication List        Accurate as of May 28, 2023  8:14 PM. If you have any questions, ask your nurse or doctor.          amLODipine 5 MG tablet Commonly known as: NORVASC Take 1 tablet (5 mg total) by mouth daily.   ferrous sulfate 324 MG Tbec Take 324 mg by mouth daily with breakfast.   ibuprofen 800 MG tablet Commonly known as: ADVIL Take 800 mg by mouth every 8 (eight) hours as needed.   multivitamin tablet Take 1 tablet by mouth daily.   rosuvastatin 40 MG tablet Commonly known as: CRESTOR Take 0.5 tablets by mouth daily.   sildenafil 20 MG tablet Commonly known as: REVATIO Take 2-5 tablets (40-100 mg total) by mouth once daily as needed   sildenafil 20 MG tablet Commonly known as:  REVATIO Take 1 tablet (20 mg total) by mouth daily as needed. 2-5 tablets        Allergies: No Known Allergies  Family History: No family history on file.  Social History:  reports that he has never smoked. He has never used smokeless tobacco. He reports current alcohol use. He reports that he does not use drugs.  ROS: Pertinent ROS in HPI  Physical Exam: There were no vitals taken for this visit.  Constitutional:  Well nourished. Alert and oriented, No acute distress. HEENT: [place] AT, moist mucus membranes.  Trachea midline Cardiovascular: No clubbing, cyanosis, or edema. Respiratory: Normal respiratory effort, no increased work of breathing. Neurologic: Grossly intact, no focal deficits, moving all 4 extremities. Psychiatric: Normal mood and affect.  Laboratory Data: Results for orders placed or performed in visit on 03/20/23  Hematocrit  Result Value Ref Range   Hematocrit 48.3 37.5 - 51.0 %  Hemoglobin  Result Value Ref Range   Hemoglobin 15.9 13.0 - 17.7 g/dL  PSA  Result Value Ref Range   Prostate Specific Ag, Serum 0.8 0.0 - 4.0 ng/mL  Testosterone  Result Value Ref Range   Testosterone 456 264 - 916 ng/dL    Comprehensive Metabolic Panel (CMP) Order: [id number] Component Ref Range & Units 2 mo ago Glucose 70 - 110 mg/dL 95 Sodium 846 - 962 mmol/L 140 Potassium 3.6 - 5.1 mmol/L 4.7 Chloride  97 - 109 mmol/L 107 Carbon Dioxide (CO2) 22.0 - 32.0 mmol/L 29.9 Urea Nitrogen (BUN) 7 - 25 mg/dL 20 Creatinine 0.7 - 1.3 mg/dL 1.4 High  Glomerular Filtration Rate (eGFR) >60 mL/min/1.73sq m 56 Low  Comment: CKD-EPI (2021) does not include patient's race in the calculation of eGFR.  Monitoring changes of plasma creatinine and eGFR over time is useful for monitoring kidney function.  Interpretive Ranges for eGFR (CKD-EPI 2021):  eGFR:       >60 mL/min/1.73 sq. m - Normal eGFR:       30-59 mL/min/1.73 sq. m - Moderately Decreased eGFR:       15-29  mL/min/1.73 sq. m  - Severely Decreased eGFR:       < 15 mL/min/1.73 sq. m  - Kidney Failure   Note: These eGFR calculations do not apply in acute situations when eGFR is changing rapidly or patients on dialysis. Calcium 8.7 - 10.3 mg/dL 9.0 AST 8 - 39 U/L 18 ALT 6 - 57 U/L 21 Alk Phos (alkaline Phosphatase) 34 - 104 U/L 49 Albumin 3.5 - 4.8 g/dL 4.3 Bilirubin, Total 0.3 - 1.2 mg/dL 0.6 Protein, Total 6.1 - 7.9 g/dL 6.8 A/G Ratio 1.0 - 5.0 gm/dL 1.7 Resulting Agency [facility]  Specimen Collected: 01/02/23 10:30 Performed by: [facility] Last Resulted: 01/02/23 15:29 Received From: [facility]  Result Received: 01/05/23 09:09   Hemoglobin A1C Order: [id number] Component Ref Range & Units 2 mo ago Hemoglobin A1C 4.2 - 5.6 % 5.9 High  Average Blood Glucose (Calc) mg/dL 409 Resulting Agency [facility] Narrative Performed by [facility] Normal Range:    4.2 - 5.6% Increased Risk:  5.7 - 6.4% Diabetes:        >= 6.5% Glycemic Control for adults with diabetes:  <7%    Specimen Collected: 01/02/23 10:30 Performed by: [facility] Last Resulted: 01/02/23 11:39 Received From: [facility]  Result Received: 01/05/23 09:09  Lipid Panel w/calc LDL Order: [id number] Component Ref Range & Units 2 mo ago Cholesterol, Total 100 - 200 mg/dL 956 Triglyceride 35 - 213 mg/dL 96 HDL (High Density Lipoprotein) Cholesterol 29.0 - 71.0 mg/dL 08.6 LDL Calculated 0 - 130 mg/dL 578 VLDL Cholesterol mg/dL 19 Cholesterol/HDL Ratio 3.9 Resulting Agency [facility]  Specimen Collected: 01/02/23 10:30 Performed by: [facility] Last Resulted: 01/02/23 15:36 [facility]  Result Received: 01/05/23 09:09 I have reviewed the labs.   Pertinent Imaging: N/A  Assessment & Plan:    1.  Testosterone deficiency  -testosterone levels are therapeutic -H & H -WNL -continue Testopel insertion  2. BPH with LUTS -PSA stable -continue conservative management, avoiding bladder irritants and timed voiding's  3. Erectile dysfunction:    -Continue sildenafil 20 mg, on-demand dosing -Prescription sent to [facility] so he can use good Rx coupon     No follow-ups on file.  These notes generated with voice recognition software. I apologize for typographical errors.  Cloretta Ned  [facility] [address]  [address] [place], [place] [address] [phone]

## 2023-05-29 ENCOUNTER — Other Ambulatory Visit: Payer: 59

## 2023-05-29 ENCOUNTER — Other Ambulatory Visit: Payer: Self-pay

## 2023-05-29 DIAGNOSIS — E291 Testicular hypofunction: Secondary | ICD-10-CM | POA: Diagnosis not present

## 2023-05-30 LAB — HEMOGLOBIN AND HEMATOCRIT, BLOOD
Hematocrit: 49 % (ref 37.5–51.0)
Hemoglobin: 15.9 g/dL (ref 13.0–17.7)

## 2023-05-30 LAB — TESTOSTERONE: Testosterone: 787 ng/dL (ref 264–916)

## 2023-06-03 ENCOUNTER — Other Ambulatory Visit: Payer: Self-pay | Admitting: *Deleted

## 2023-06-03 DIAGNOSIS — N529 Male erectile dysfunction, unspecified: Secondary | ICD-10-CM

## 2023-06-03 DIAGNOSIS — E291 Testicular hypofunction: Secondary | ICD-10-CM

## 2023-07-03 DIAGNOSIS — E7800 Pure hypercholesterolemia, unspecified: Secondary | ICD-10-CM | POA: Diagnosis not present

## 2023-07-03 DIAGNOSIS — E118 Type 2 diabetes mellitus with unspecified complications: Secondary | ICD-10-CM | POA: Diagnosis not present

## 2023-07-03 DIAGNOSIS — N183 Chronic kidney disease, stage 3 unspecified: Secondary | ICD-10-CM | POA: Diagnosis not present

## 2023-07-03 DIAGNOSIS — I129 Hypertensive chronic kidney disease with stage 1 through stage 4 chronic kidney disease, or unspecified chronic kidney disease: Secondary | ICD-10-CM | POA: Diagnosis not present

## 2023-07-03 DIAGNOSIS — Z2821 Immunization not carried out because of patient refusal: Secondary | ICD-10-CM | POA: Diagnosis not present

## 2023-07-28 ENCOUNTER — Other Ambulatory Visit: Payer: 59

## 2023-07-28 DIAGNOSIS — N529 Male erectile dysfunction, unspecified: Secondary | ICD-10-CM | POA: Diagnosis not present

## 2023-07-28 DIAGNOSIS — E291 Testicular hypofunction: Secondary | ICD-10-CM | POA: Diagnosis not present

## 2023-07-29 ENCOUNTER — Other Ambulatory Visit: Payer: Self-pay

## 2023-07-29 DIAGNOSIS — E291 Testicular hypofunction: Secondary | ICD-10-CM

## 2023-07-29 LAB — HEMOGLOBIN AND HEMATOCRIT, BLOOD
Hematocrit: 49.9 % (ref 37.5–51.0)
Hemoglobin: 15.9 g/dL (ref 13.0–17.7)

## 2023-07-29 LAB — TESTOSTERONE: Testosterone: 468 ng/dL (ref 264–916)

## 2023-09-23 ENCOUNTER — Other Ambulatory Visit: Payer: 59

## 2023-09-23 DIAGNOSIS — E291 Testicular hypofunction: Secondary | ICD-10-CM | POA: Diagnosis not present

## 2023-09-24 LAB — HEMOGLOBIN AND HEMATOCRIT, BLOOD
Hematocrit: 46.9 % (ref 37.5–51.0)
Hemoglobin: 15.2 g/dL (ref 13.0–17.7)

## 2023-09-24 LAB — PSA: Prostate Specific Ag, Serum: 0.8 ng/mL (ref 0.0–4.0)

## 2023-09-24 LAB — TESTOSTERONE: Testosterone: 197 ng/dL — ABNORMAL LOW (ref 264–916)

## 2023-09-29 NOTE — Progress Notes (Unsigned)
10/01/2023 9:45 AM   [name] 07-29-1956 [id number]  Referring provider: Lauro Regulus, MD [address] [facility] [place],  [place] [address]  Urological history: 1.  Erectile dysfunction -Contributing factors of age, BPH, hypogonadism, hyperlipidemia, diabetes, CKD and alcohol consumption -Sildenafil 20 mg, on-demand dosing  2. BPH with LU TS -PSA (09/2023) 0.8  3.  Hypogonadism -Contributing factors of age, diabetes and CKD.   -Testosterone level (09/2023) 197 -Hemoglobin/hematocrit (09/2023) 15.2/46.9 -Testopel q 90 days (last insertion 04/2023)   Chief Complaint  Patient presents with   Benign Prostatic Hypertrophy   Erectile Dysfunction    HPI: Martin Church is a 68 y.o. male who presents for follow up.    Previous records reviewed.   I PSS 9/2  He has no urinary complaints.  Patient denies any modifying or aggravating factors.  Patient denies any recent UTI's, gross hematuria, dysuria or suprapubic/flank pain.  Patient denies any fevers, chills, nausea or vomiting.     IPSS     Row Name 10/01/23 0900         International Prostate Symptom Score   How often have you had the sensation of not emptying your bladder? About half the time     How often have you had to urinate less than every two hours? Less than 1 in 5 times     How often have you found you stopped and started again several times when you urinated? About half the time     How often have you found it difficult to postpone urination? Less than 1 in 5 times     How often have you had a weak urinary stream? Less than 1 in 5 times     How often have you had to strain to start urination? Not at All     How many times did you typically get up at night to urinate? None     Total IPSS Score 9       Quality of Life due to urinary symptoms   If you were to spend the rest of your life with your urinary condition just the way it is now how would you feel about that?  Mostly Satisfied              Score:  1-7 Mild 8-19 Moderate 20-35 Severe   SHIM 16  He is having success with sildenafil 20 mg on demand dosing.  He is not having spontaneous erections.  He is not having pain or curvature with erections.   SHIM     Row Name 10/01/23 [phone]         SHIM: Over the last 6 months:   How do you rate your confidence that you could get and keep an erection? Moderate     When you had erections with sexual stimulation, how often were your erections hard enough for penetration (entering your partner)? A Few Times (much less than half the time)     During sexual intercourse, how often were you able to maintain your erection after you had penetrated (entered) your partner? Sometimes (about half the time)     During sexual intercourse, how difficult was it to maintain your erection to completion of intercourse? Slightly Difficult     When you attempted sexual intercourse, how often was it satisfactory for you? Most Times (much more than half the time)       SHIM Total Score   SHIM 16  Score: 1-7 Severe ED 8-11 Moderate ED 12-16 Mild-Moderate ED 17-21 Mild ED 22-25 No ED    PMH: Past Medical History:  Diagnosis Date   ED (erectile dysfunction)    HLD (hyperlipidemia)    Vitamin D deficiency     Surgical History: Past Surgical History:  Procedure Laterality Date   APPENDECTOMY     COLONOSCOPY     COLONOSCOPY WITH PROPOFOL N/A 06/28/2021   Procedure: COLONOSCOPY WITH PROPOFOL;  Surgeon: Regis Bill, MD;  Location: [facility] ENDOSCOPY;  Service: Endoscopy;  Laterality: N/A;    Home Medications:  Allergies as of 10/01/2023   No Known Allergies      Medication List        Accurate as of October 01, 2023  9:45 AM. If you have any questions, ask your nurse or doctor.          amLODipine 5 MG tablet Commonly known as: NORVASC Take 1 tablet (5 mg total) by mouth daily.   ferrous sulfate 324 MG Tbec Take 324  mg by mouth daily with breakfast.   ibuprofen 800 MG tablet Commonly known as: ADVIL Take 800 mg by mouth every 8 (eight) hours as needed.   multivitamin tablet Take 1 tablet by mouth daily.   rosuvastatin 40 MG tablet Commonly known as: CRESTOR Take 0.5 tablets by mouth daily.   sildenafil 20 MG tablet Commonly known as: REVATIO Take 2-5 tablets (40-100 mg total) by mouth once daily as needed        Allergies: No Known Allergies  Family History: No family history on file.  Social History:  reports that he has never smoked. He has never used smokeless tobacco. He reports current alcohol use. He reports that he does not use drugs.  ROS: Pertinent ROS in HPI  Physical Exam: BP (!) 141/95   Pulse 88   Ht 6\' 2"  (1.88 m)   Wt 237 lb (107.5 kg)   BMI 30.43 kg/m   Constitutional:  Well nourished. Alert and oriented, No acute distress. HEENT: [place] AT, moist mucus membranes.  Trachea midline, no masses. Cardiovascular: No clubbing, cyanosis, or edema. Respiratory: Normal respiratory effort, no increased work of breathing. GU: No CVA tenderness.  No bladder fullness or masses.  Patient with uncircumcised phallus.  Foreskin easily retracted  Urethral meatus is patent.  No penile discharge. No penile lesions or rashes. Scrotum without lesions, cysts, rashes and/or edema.  Testicles are located scrotally bilaterally. No masses are appreciated in the testicles. Left and right epididymis are normal. Rectal: Patient with  normal sphincter tone. Anus and perineum without scarring or rashes. No rectal masses are appreciated. Prostate is approximately 40 grams, irregular, no nodules are appreciated. Seminal vesicles could not be palpated.  Neurologic: Grossly intact, no focal deficits, moving all 4 extremities. Psychiatric: Normal mood and affect.   Laboratory Data: Results for orders placed or performed in visit on 09/23/23  PSA   Collection Time: 09/23/23  8:43 AM  Result Value Ref  Range   Prostate Specific Ag, Serum 0.8 0.0 - 4.0 ng/mL  Testosterone   Collection Time: 09/23/23  8:43 AM  Result Value Ref Range   Testosterone 197 (L) 264 - 916 ng/dL  Hemoglobin and hematocrit, blood   Collection Time: 09/23/23  8:43 AM  Result Value Ref Range   Hemoglobin 15.2 13.0 - 17.7 g/dL   Hematocrit 16.1 09.6 - 51.0 %  I have reviewed the labs.   Pertinent Imaging: N/A  Assessment & Plan:  1. Hypogonadism -testosterone levels are sub therapeutic -H & H -WNL -continue Testopel insertion  2. BPH with LUTS -PSA stable -continue conservative management, avoiding bladder irritants and timed voiding's  3. Erectile dysfunction:    -Continue sildenafil 20 mg, on-demand dosing -Prescription sent to [facility] so he can use good Rx coupon     Return for return for Testopel .  These notes generated with voice recognition software. I apologize for typographical errors.  Cloretta Ned  [facility] [address]  [address] [place], [place] [address] [phone]

## 2023-10-01 ENCOUNTER — Telehealth: Payer: Self-pay

## 2023-10-01 ENCOUNTER — Encounter: Payer: Self-pay | Admitting: Urology

## 2023-10-01 ENCOUNTER — Ambulatory Visit: Payer: 59 | Admitting: Urology

## 2023-10-01 VITALS — BP 141/95 | HR 88 | Ht 74.0 in | Wt 237.0 lb

## 2023-10-01 DIAGNOSIS — N401 Enlarged prostate with lower urinary tract symptoms: Secondary | ICD-10-CM

## 2023-10-01 DIAGNOSIS — E291 Testicular hypofunction: Secondary | ICD-10-CM

## 2023-10-01 DIAGNOSIS — N138 Other obstructive and reflux uropathy: Secondary | ICD-10-CM

## 2023-10-01 DIAGNOSIS — N529 Male erectile dysfunction, unspecified: Secondary | ICD-10-CM | POA: Diagnosis not present

## 2023-10-01 MED ORDER — SILDENAFIL CITRATE 20 MG PO TABS: 40.0000 mg | ORAL_TABLET | Freq: Every day | ORAL | 5 refills | Status: DC | PRN

## 2023-10-01 NOTE — Telephone Encounter (Signed)
Per Carollee Herter "We need to get him scheduled for Testopel insertion-he prefers first a.m. appointments-we also need to make sure his insurance will still cover it "  Sending to [facility] to work on this, patient was advised we would call him to schedule his appointment once we have PA done if needed.

## 2023-10-12 NOTE — Telephone Encounter (Signed)
 Patient called asking about testopel  approval

## 2023-10-12 NOTE — Telephone Encounter (Signed)
 Called pt to let him know that I will be working on his PA for testopel  today. Informed pt that he is next on my list, pt voiced understanding.

## 2023-10-13 NOTE — Telephone Encounter (Signed)
No PA was required. Pt informed, app scheduled.

## 2023-10-14 NOTE — Progress Notes (Unsigned)
   He presents today for Testopel insertion.  Identified upper outer quadrant of right hip for insertion; prepped area with Betadine and injected 10 cc's of Lidocaine 1%  to anesthetize superficially and distally along trocar tract.  Made 3 mm incision using 11 blade of scalpel; trocar with sharp ended stylet was inserted into subcutaneous tissue in line with femur. Sharp stylet was withdrawn and 6 pellets were placed into trocar well. Testopel pellets advanced into tissue using blunt ended stylet. Trocar removed and incision closed using 6 Steri-Strips. Cleansed area to remove Betadine and covered Steri-Strips with outer Band-Aid.  Careful inspection of insertion is done and patient informed of post procedure instructions.  Advised patient to apply ice to the site for 20-30 minutes every hour if needed.  Avoid hot tubes, swimming or full water immersion of the insertion site for 72 hours.  Bandage may be removed after one week.    Patient is advised to contact the office if experiencing drainage of the insertion site, excessive redness or swelling of the site, chills and/or fevers > 101.5, nausea or vomiting, dizziness or lightheadedness and excessive tenderness.  Avoid strenuous activity and heavy lifting for 72 hours.     He will return in one month for serum testosterone, hemoglobin and hematocrit.

## 2023-10-16 ENCOUNTER — Encounter: Payer: Self-pay | Admitting: Urology

## 2023-10-16 ENCOUNTER — Ambulatory Visit (INDEPENDENT_AMBULATORY_CARE_PROVIDER_SITE_OTHER): Payer: 59 | Admitting: Urology

## 2023-10-16 VITALS — BP 159/84 | HR 88 | Ht 74.0 in | Wt 237.0 lb

## 2023-10-16 DIAGNOSIS — E291 Testicular hypofunction: Secondary | ICD-10-CM

## 2023-10-16 MED ORDER — TESTOSTERONE 75 MG IL PLLT
75.0000 mg | PELLET | Freq: Once | Status: AC
  Administered 2023-10-16: 75 mg

## 2023-10-16 NOTE — Addendum Note (Signed)
Addended by: Winn Jock on: 10/16/2023 09:25 AM   Modules accepted: Orders

## 2023-10-30 ENCOUNTER — Other Ambulatory Visit: Payer: 59

## 2023-10-30 DIAGNOSIS — E291 Testicular hypofunction: Secondary | ICD-10-CM

## 2023-10-31 LAB — HEMOGLOBIN AND HEMATOCRIT, BLOOD
Hematocrit: 44.5 % (ref 37.5–51.0)
Hemoglobin: 14.6 g/dL (ref 13.0–17.7)

## 2023-10-31 LAB — PSA: Prostate Specific Ag, Serum: 0.8 ng/mL (ref 0.0–4.0)

## 2023-10-31 LAB — TESTOSTERONE: Testosterone: 723 ng/dL (ref 264–916)

## 2023-11-03 DIAGNOSIS — R252 Cramp and spasm: Secondary | ICD-10-CM | POA: Diagnosis not present

## 2023-11-03 DIAGNOSIS — E291 Testicular hypofunction: Secondary | ICD-10-CM | POA: Diagnosis not present

## 2023-11-03 DIAGNOSIS — I10 Essential (primary) hypertension: Secondary | ICD-10-CM | POA: Diagnosis not present

## 2023-11-03 DIAGNOSIS — R7303 Prediabetes: Secondary | ICD-10-CM | POA: Diagnosis not present

## 2023-11-11 NOTE — Progress Notes (Deleted)
   He presents today for Testopel insertion.  Identified upper outer quadrant of right hip for insertion; prepped area with Betadine and injected 10 cc's of Lidocaine 1%  to anesthetize superficially and distally along trocar tract.  Made 3 mm incision using 11 blade of scalpel; trocar with sharp ended stylet was inserted into subcutaneous tissue in line with femur. Sharp stylet was withdrawn and 6 pellets were placed into trocar well. Testopel pellets advanced into tissue using blunt ended stylet. Trocar removed and incision closed using 6 Steri-Strips. Cleansed area to remove Betadine and covered Steri-Strips with outer Band-Aid.  Careful inspection of insertion is done and patient informed of post procedure instructions.  Advised patient to apply ice to the site for 20-30 minutes every hour if needed.  Avoid hot tubes, swimming or full water immersion of the insertion site for 72 hours.  Bandage may be removed after one week.    Patient is advised to contact the office if experiencing drainage of the insertion site, excessive redness or swelling of the site, chills and/or fevers > 101.5, nausea or vomiting, dizziness or lightheadedness and excessive tenderness.  Avoid strenuous activity and heavy lifting for 72 hours.     He will return in one month for serum testosterone, hemoglobin and hematocrit.

## 2023-11-13 ENCOUNTER — Ambulatory Visit: Payer: 59 | Admitting: Urology

## 2023-11-16 DIAGNOSIS — R748 Abnormal levels of other serum enzymes: Secondary | ICD-10-CM | POA: Diagnosis not present

## 2023-12-08 DIAGNOSIS — I129 Hypertensive chronic kidney disease with stage 1 through stage 4 chronic kidney disease, or unspecified chronic kidney disease: Secondary | ICD-10-CM | POA: Diagnosis not present

## 2023-12-08 DIAGNOSIS — M25551 Pain in right hip: Secondary | ICD-10-CM | POA: Diagnosis not present

## 2023-12-08 DIAGNOSIS — E118 Type 2 diabetes mellitus with unspecified complications: Secondary | ICD-10-CM | POA: Diagnosis not present

## 2023-12-08 DIAGNOSIS — E7800 Pure hypercholesterolemia, unspecified: Secondary | ICD-10-CM | POA: Diagnosis not present

## 2023-12-08 DIAGNOSIS — N183 Chronic kidney disease, stage 3 unspecified: Secondary | ICD-10-CM | POA: Diagnosis not present

## 2023-12-23 ENCOUNTER — Other Ambulatory Visit: Payer: Self-pay

## 2023-12-23 DIAGNOSIS — N183 Chronic kidney disease, stage 3 unspecified: Secondary | ICD-10-CM | POA: Diagnosis not present

## 2023-12-23 DIAGNOSIS — M1611 Unilateral primary osteoarthritis, right hip: Secondary | ICD-10-CM | POA: Diagnosis not present

## 2023-12-23 DIAGNOSIS — E291 Testicular hypofunction: Secondary | ICD-10-CM | POA: Diagnosis not present

## 2023-12-23 DIAGNOSIS — I129 Hypertensive chronic kidney disease with stage 1 through stage 4 chronic kidney disease, or unspecified chronic kidney disease: Secondary | ICD-10-CM | POA: Diagnosis not present

## 2023-12-23 DIAGNOSIS — M25551 Pain in right hip: Secondary | ICD-10-CM | POA: Diagnosis not present

## 2023-12-23 DIAGNOSIS — R7303 Prediabetes: Secondary | ICD-10-CM | POA: Diagnosis not present

## 2023-12-23 MED ORDER — PREDNISONE 10 MG PO TABS
ORAL_TABLET | ORAL | 0 refills | Status: DC
  Filled 2023-12-23: qty 21, 6d supply, fill #0

## 2024-01-12 ENCOUNTER — Other Ambulatory Visit: Payer: Self-pay

## 2024-01-12 DIAGNOSIS — Z125 Encounter for screening for malignant neoplasm of prostate: Secondary | ICD-10-CM

## 2024-01-13 ENCOUNTER — Other Ambulatory Visit: Payer: Self-pay

## 2024-01-13 DIAGNOSIS — M1611 Unilateral primary osteoarthritis, right hip: Secondary | ICD-10-CM | POA: Diagnosis not present

## 2024-01-13 MED ORDER — MELOXICAM 15 MG PO TABS
15.0000 mg | ORAL_TABLET | Freq: Every day | ORAL | 0 refills | Status: DC
  Filled 2024-01-13: qty 30, 30d supply, fill #0

## 2024-01-14 ENCOUNTER — Other Ambulatory Visit: Payer: Self-pay | Admitting: Orthopedic Surgery

## 2024-01-15 ENCOUNTER — Other Ambulatory Visit

## 2024-01-18 ENCOUNTER — Other Ambulatory Visit

## 2024-01-18 DIAGNOSIS — Z125 Encounter for screening for malignant neoplasm of prostate: Secondary | ICD-10-CM | POA: Diagnosis not present

## 2024-01-19 ENCOUNTER — Ambulatory Visit: Payer: Self-pay | Admitting: Urology

## 2024-01-19 LAB — HEMOGLOBIN AND HEMATOCRIT, BLOOD
Hematocrit: 47.8 % (ref 37.5–51.0)
Hemoglobin: 15.4 g/dL (ref 13.0–17.7)

## 2024-01-19 LAB — PSA: Prostate Specific Ag, Serum: 1.1 ng/mL (ref 0.0–4.0)

## 2024-01-19 LAB — TESTOSTERONE: Testosterone: 262 ng/dL — ABNORMAL LOW (ref 264–916)

## 2024-01-20 DIAGNOSIS — M1611 Unilateral primary osteoarthritis, right hip: Secondary | ICD-10-CM | POA: Diagnosis not present

## 2024-01-20 DIAGNOSIS — N183 Chronic kidney disease, stage 3 unspecified: Secondary | ICD-10-CM | POA: Diagnosis not present

## 2024-01-20 DIAGNOSIS — I129 Hypertensive chronic kidney disease with stage 1 through stage 4 chronic kidney disease, or unspecified chronic kidney disease: Secondary | ICD-10-CM | POA: Diagnosis not present

## 2024-01-20 DIAGNOSIS — E118 Type 2 diabetes mellitus with unspecified complications: Secondary | ICD-10-CM | POA: Diagnosis not present

## 2024-01-21 ENCOUNTER — Encounter
Admission: RE | Admit: 2024-01-21 | Discharge: 2024-01-21 | Disposition: A | Discharge status: Home / Self Care | Source: Ambulatory Visit | Attending: Orthopedic Surgery | Admitting: Orthopedic Surgery

## 2024-01-21 ENCOUNTER — Other Ambulatory Visit: Payer: Self-pay

## 2024-01-21 VITALS — BP 158/88 | HR 84 | Resp 14 | Ht 74.0 in | Wt 238.4 lb

## 2024-01-21 DIAGNOSIS — Z01818 Encounter for other preprocedural examination: Secondary | ICD-10-CM | POA: Insufficient documentation

## 2024-01-21 DIAGNOSIS — Z01810 Encounter for preprocedural cardiovascular examination: Secondary | ICD-10-CM | POA: Diagnosis not present

## 2024-01-21 HISTORY — DX: Unilateral primary osteoarthritis, right hip: M16.11

## 2024-01-21 HISTORY — DX: Essential (primary) hypertension: I10

## 2024-01-21 HISTORY — DX: Chronic kidney disease, stage 3 unspecified: N18.30

## 2024-01-21 HISTORY — DX: Type 2 diabetes mellitus without complications: E11.9

## 2024-01-21 HISTORY — DX: Benign neoplasm of colon, unspecified: D12.6

## 2024-01-21 LAB — CBC WITH DIFFERENTIAL/PLATELET
Abs Immature Granulocytes: 0.08 10*3/uL — ABNORMAL HIGH (ref 0.00–0.07)
Basophils Absolute: 0 10*3/uL (ref 0.0–0.1)
Basophils Relative: 0 %
Eosinophils Absolute: 0.1 10*3/uL (ref 0.0–0.5)
Eosinophils Relative: 1 %
HCT: 50 % (ref 39.0–52.0)
Hemoglobin: 16.6 g/dL (ref 13.0–17.0)
Immature Granulocytes: 1 %
Lymphocytes Relative: 28 %
Lymphs Abs: 1.9 10*3/uL (ref 0.7–4.0)
MCH: 27.3 pg (ref 26.0–34.0)
MCHC: 33.2 g/dL (ref 30.0–36.0)
MCV: 82.4 fL (ref 80.0–100.0)
Monocytes Absolute: 0.5 10*3/uL (ref 0.1–1.0)
Monocytes Relative: 8 %
Neutro Abs: 4.3 10*3/uL (ref 1.7–7.7)
Neutrophils Relative %: 62 %
Platelets: 170 10*3/uL (ref 150–400)
RBC: 6.07 MIL/uL — ABNORMAL HIGH (ref 4.22–5.81)
RDW: 12.8 % (ref 11.5–15.5)
WBC: 7 10*3/uL (ref 4.0–10.5)
nRBC: 0 % (ref 0.0–0.2)

## 2024-01-21 LAB — URINALYSIS, ROUTINE W REFLEX MICROSCOPIC
Bilirubin Urine: NEGATIVE
Glucose, UA: NEGATIVE mg/dL
Hgb urine dipstick: NEGATIVE
Ketones, ur: NEGATIVE mg/dL
Leukocytes,Ua: NEGATIVE
Nitrite: NEGATIVE
Protein, ur: NEGATIVE mg/dL
Specific Gravity, Urine: 1.018 (ref 1.005–1.030)
pH: 5 (ref 5.0–8.0)

## 2024-01-21 LAB — COMPREHENSIVE METABOLIC PANEL WITH GFR
ALT: 25 U/L (ref 0–44)
AST: 23 U/L (ref 15–41)
Albumin: 4.1 g/dL (ref 3.5–5.0)
Alkaline Phosphatase: 44 U/L (ref 38–126)
Anion gap: 7 (ref 5–15)
BUN: 19 mg/dL (ref 8–23)
CO2: 27 mmol/L (ref 22–32)
Calcium: 8.9 mg/dL (ref 8.9–10.3)
Chloride: 106 mmol/L (ref 98–111)
Creatinine, Ser: 1.25 mg/dL — ABNORMAL HIGH (ref 0.61–1.24)
GFR, Estimated: >60 mL/min (ref >60)
Glucose, Bld: 80 mg/dL (ref 70–99)
Potassium: 3.9 mmol/L (ref 3.5–5.1)
Sodium: 140 mmol/L (ref 135–145)
Total Bilirubin: 0.8 mg/dL (ref 0.0–1.2)
Total Protein: 7.2 g/dL (ref 6.5–8.1)

## 2024-01-21 LAB — TYPE AND SCREEN
ABO/RH(D): A POS
Antibody Screen: NEGATIVE

## 2024-01-21 LAB — SURGICAL PCR SCREEN
MRSA, PCR: NEGATIVE
Staphylococcus aureus: NEGATIVE

## 2024-01-21 NOTE — Patient Instructions (Addendum)
 Your procedure is scheduled on:02-01-24 Monday Report to the Registration Desk on the 1st floor of the [facility].Then proceed to the 2nd floor Surgery Desk To find out your arrival time, please call [phone] between 1PM - 3PM on:01-29-24 Friday If your arrival time is 6:00 am, do not arrive before that time as the [facility] entrance doors do not open until 6:00 am.  REMEMBER: Instructions that are not followed completely may result in serious medical risk, up to and including death; or upon the discretion of your surgeon and anesthesiologist your surgery may need to be rescheduled.  Do not eat food after midnight the night before surgery.  No gum chewing or hard candies.  You may however, drink Water up to 2 hours before you are scheduled to arrive for your surgery. Do not drink anything within 2 hours of your scheduled arrival time.  In addition, your doctor has ordered for you to drink the provided:  Gatorade G2 Drinking this carbohydrate drink up to two hours before surgery helps to reduce insulin resistance and improve patient outcomes. Please complete drinking 2 hours before scheduled arrival time.  One week prior to surgery:Last dose will be on 01-24-24  Stop Anti-inflammatories (NSAIDS) such as meloxicam  (MOBIC ), Advil, Aleve , Ibuprofen, Motrin, Naproxen , Naprosyn  and Aspirin based products such as Excedrin, Goody's Powder, BC Powder. Stop ANY OVER THE COUNTER supplements until after surgery (Vitamin B12, Ferrous Sulfate, Vitamin E, Multivitamin)  You may however, continue to take Tylenol  if needed for pain up until the day of surgery.  Stop sildenafil  (VIAGRA ) 2 days prior to surgery-Last dose will be on 01-29-24 Friday  Continue taking all of your other prescription medications up until the day of surgery.  ON THE DAY OF SURGERY ONLY TAKE THESE MEDICATIONS WITH SIPS OF WATER: -amLODipine  (NORVASC )   No Alcohol for 24 hours before or after surgery.  No Smoking including  e-cigarettes for 24 hours before surgery.  No chewable tobacco products for at least 6 hours before surgery.  No nicotine patches on the day of surgery.  Do not use any "recreational" drugs for at least a week (preferably 2 weeks) before your surgery.  Please be advised that the combination of cocaine and anesthesia may have negative outcomes, up to and including death. If you test positive for cocaine, your surgery will be cancelled.  On the morning of surgery brush your teeth with toothpaste and water, you may rinse your mouth with mouthwash if you wish. Do not swallow any toothpaste or mouthwash.  Use CHG Soap as directed on instruction sheet.  Do not wear jewelry, make-up, hairpins, clips or nail polish.  For welded (permanent) jewelry: bracelets, anklets, waist bands, etc.  Please have this removed prior to surgery.  If it is not removed, there is a chance that hospital personnel will need to cut it off on the day of surgery.  Do not wear lotions, powders, or perfumes.   Do not shave body hair from the neck down 48 hours before surgery.  Contact lenses, hearing aids and dentures may not be worn into surgery.  Do not bring valuables to the hospital. [facility] is not responsible for any missing/lost belongings or valuables.   Notify your doctor if there is any change in your medical condition (cold, fever, infection).  Wear comfortable clothing (specific to your surgery type) to the hospital.  After surgery, you can help prevent lung complications by doing breathing exercises.  Take deep breaths and cough every 1-2  hours. Your doctor may order a device called an Incentive Spirometer to help you take deep breaths. When coughing or sneezing, hold a pillow firmly against your incision with both hands. This is called "splinting." Doing this helps protect your incision. It also decreases belly discomfort.  If you are being admitted to the hospital overnight, leave your suitcase in  the car. After surgery it may be brought to your room.  In case of increased patient census, it may be necessary for you, the patient, to continue your postoperative care in the Same Day Surgery department.  If you are being discharged the day of surgery, you will not be allowed to drive home. You will need a responsible individual to drive you home and stay with you for 24 hours after surgery.   If you are taking public transportation, you will need to have a responsible individual with you.  Please call the Pre-admissions Testing Dept. at [phone] if you have any questions about these instructions.  Surgery Visitation Policy:  Patients having surgery or a procedure may have two visitors.  Children under the age of 70 must have an adult with them who is not the patient.  Inpatient Visitation:    Visiting hours are 7 a.m. to 8 p.m. Up to four visitors are allowed at one time in a patient room. The visitors may rotate out with other people during the day.  One visitor age 70 or older may stay with the patient overnight and must be in the room by 8 p.m.    Pre-operative 5 CHG Bath Instructions   You can play a key role in reducing the risk of infection after surgery. Your skin needs to be as free of germs as possible. You can reduce the number of germs on your skin by washing with CHG (chlorhexidine  gluconate) soap before surgery. CHG is an antiseptic soap that kills germs and continues to kill germs even after washing.   DO NOT use if you have an allergy to chlorhexidine /CHG or antibacterial soaps. If your skin becomes reddened or irritated, stop using the CHG and notify one of our RNs at [phone].   Please shower with the CHG soap starting 4 days before surgery using the following schedule:     Please keep in mind the following:  DO NOT shave, including legs and underarms, starting the day of your first shower.   You may shave your face at any point before/day of  surgery.  Place clean sheets on your bed the day you start using CHG soap. Use a clean washcloth (not used since being washed) for each shower. DO NOT sleep with pets once you start using the CHG.   CHG Shower Instructions:  If you choose to wash your hair and private area, wash first with your normal shampoo/soap.  After you use shampoo/soap, rinse your hair and body thoroughly to remove shampoo/soap residue.  Turn the water OFF and apply about 3 tablespoons (45 ml) of CHG soap to a CLEAN washcloth.  Apply CHG soap ONLY FROM YOUR NECK DOWN TO YOUR TOES (washing for 3-5 minutes)  DO NOT use CHG soap on face, private areas, open wounds, or sores.  Pay special attention to the area where your surgery is being performed.  If you are having back surgery, having someone wash your back for you may be helpful. Wait 2 minutes after CHG soap is applied, then you may rinse off the CHG soap.  Pat dry with a clean towel  Put on clean clothes/pajamas   If you choose to wear lotion, please use ONLY the CHG-compatible lotions on the back of this paper.     Additional instructions for the day of surgery: DO NOT APPLY any lotions, deodorants, cologne, or perfumes.   Put on clean/comfortable clothes.  Brush your teeth.  Ask your nurse before applying any prescription medications to the skin.      CHG Compatible Lotions   Aveeno Moisturizing lotion  Cetaphil Moisturizing Cream  Cetaphil Moisturizing Lotion  Clairol Herbal Essence Moisturizing Lotion, Dry Skin  Clairol Herbal Essence Moisturizing Lotion, Extra Dry Skin  Clairol Herbal Essence Moisturizing Lotion, Normal Skin  Curel Age Defying Therapeutic Moisturizing Lotion with Alpha Hydroxy  Curel Extreme Care Body Lotion  Curel Soothing Hands Moisturizing Hand Lotion  Curel Therapeutic Moisturizing Cream, Fragrance-Free  Curel Therapeutic Moisturizing Lotion, Fragrance-Free  Curel Therapeutic Moisturizing Lotion, Original Formula  Eucerin  Daily Replenishing Lotion  Eucerin Dry Skin Therapy Plus Alpha Hydroxy Crme  Eucerin Dry Skin Therapy Plus Alpha Hydroxy Lotion  Eucerin Original Crme  Eucerin Original Lotion  Eucerin Plus Crme Eucerin Plus Lotion  Eucerin TriLipid Replenishing Lotion  Keri Anti-Bacterial Hand Lotion  Keri Deep Conditioning Original Lotion Dry Skin Formula Softly Scented  Keri Deep Conditioning Original Lotion, Fragrance Free Sensitive Skin Formula  Keri Lotion Fast Absorbing Fragrance Free Sensitive Skin Formula  Keri Lotion Fast Absorbing Softly Scented Dry Skin Formula  Keri Original Lotion  Keri Skin Renewal Lotion Keri Silky Smooth Lotion  Keri Silky Smooth Sensitive Skin Lotion  Nivea Body Creamy Conditioning Oil  Nivea Body Extra Enriched Lotion  Nivea Body Original Lotion  Nivea Body Sheer Moisturizing Lotion Nivea Crme  Nivea Skin Firming Lotion  NutraDerm 30 Skin Lotion  NutraDerm Skin Lotion  NutraDerm Therapeutic Skin Cream  NutraDerm Therapeutic Skin Lotion  ProShield Protective Hand Cream  Provon moisturizing lotion  How to Use an Incentive Spirometer An incentive spirometer is a tool that measures how well you are filling your lungs with each breath. Learning to take long, deep breaths using this tool can help you keep your lungs clear and active. This may help to reverse or lessen your chance of developing breathing (pulmonary) problems, especially infection. You may be asked to use a spirometer: After a surgery. If you have a lung problem or a history of smoking. After a long period of time when you have been unable to move or be active. If the spirometer includes an indicator to show the highest number that you have reached, your health care provider or respiratory therapist will help you set a goal. Keep a log of your progress as told by your health care provider. What are the risks? Breathing too quickly may cause dizziness or cause you to pass out. Take your time so you do  not get dizzy or light-headed. If you are in pain, you may need to take pain medicine before doing incentive spirometry. It is harder to take a deep breath if you are having pain. How to use your incentive spirometer  Sit up on the edge of your bed or on a chair. Hold the incentive spirometer so that it is in an upright position. Before you use the spirometer, breathe out normally. Place the mouthpiece in your mouth. Make sure your lips are closed tightly around it. Breathe in slowly and as deeply as you can through your mouth, causing the piston or the ball to rise toward the top of the chamber. Hold your  breath for 3-5 seconds, or for as long as possible. If the spirometer includes a coach indicator, use this to guide you in breathing. Slow down your breathing if the indicator goes above the marked areas. Remove the mouthpiece from your mouth and breathe out normally. The piston or ball will return to the bottom of the chamber. Rest for a few seconds, then repeat the steps 10 or more times. Take your time and take a few normal breaths between deep breaths so that you do not get dizzy or light-headed. Do this every 1-2 hours when you are awake. If the spirometer includes a goal marker to show the highest number you have reached (best effort), use this as a goal to work toward during each repetition. After each set of 10 deep breaths, cough a few times. This will help to make sure that your lungs are clear. If you have an incision on your chest or abdomen from surgery, place a pillow or a rolled-up towel firmly against the incision when you cough. This can help to reduce pain while taking deep breaths and coughing. General tips When you are able to get out of bed: Walk around often. Continue to take deep breaths and cough in order to clear your lungs. Keep using the incentive spirometer until your health care provider says it is okay to stop using it. If you have been in the hospital, you may  be told to keep using the spirometer at home. Contact a health care provider if: You are having difficulty using the spirometer. You have trouble using the spirometer as often as instructed. Your pain medicine is not giving enough relief for you to use the spirometer as told. You have a fever. Get help right away if: You develop shortness of breath. You develop a cough with bloody mucus from the lungs. You have fluid or blood coming from an incision site after you cough. Summary An incentive spirometer is a tool that can help you learn to take long, deep breaths to keep your lungs clear and active. You may be asked to use a spirometer after a surgery, if you have a lung problem or a history of smoking, or if you have been inactive for a long period of time. Use your incentive spirometer as instructed every 1-2 hours while you are awake. If you have an incision on your chest or abdomen, place a pillow or a rolled-up towel firmly against your incision when you cough. This will help to reduce pain. Get help right away if you have shortness of breath, you cough up bloody mucus, or blood comes from your incision when you cough. This information is not intended to replace advice given to you by your health care provider. Make sure you discuss any questions you have with your health care provider. Document Revised: 06/26/2023 Document Reviewed: 06/26/2023 Elsevier Patient Education  2024 [employer]  Preoperative Educational Videos for Total Hip, Knee and Shoulder Replacements  To better prepare for surgery, please view our videos that explain the physical activity and discharge planning required to have the best surgical recovery at [facility].  [email or web address]  Questions? Call [phone] or email [email or web address]

## 2024-02-01 ENCOUNTER — Encounter
Admission: RE | Disposition: A | Discharge status: Home Health | Payer: Self-pay | Source: Home / Self Care | Attending: Orthopedic Surgery

## 2024-02-01 ENCOUNTER — Other Ambulatory Visit: Payer: Self-pay

## 2024-02-01 ENCOUNTER — Ambulatory Visit
Admission: RE | Admit: 2024-02-01 | Discharge: 2024-02-02 | Disposition: A | Discharge status: Home Health | Attending: Orthopedic Surgery | Admitting: Orthopedic Surgery

## 2024-02-01 ENCOUNTER — Encounter: Payer: Self-pay | Admitting: Orthopedic Surgery

## 2024-02-01 ENCOUNTER — Ambulatory Visit

## 2024-02-01 ENCOUNTER — Ambulatory Visit: Payer: Self-pay | Admitting: Urgent Care

## 2024-02-01 ENCOUNTER — Ambulatory Visit: Admitting: Certified Registered Nurse Anesthetist

## 2024-02-01 DIAGNOSIS — M1611 Unilateral primary osteoarthritis, right hip: Secondary | ICD-10-CM | POA: Diagnosis not present

## 2024-02-01 DIAGNOSIS — N183 Chronic kidney disease, stage 3 unspecified: Secondary | ICD-10-CM | POA: Insufficient documentation

## 2024-02-01 DIAGNOSIS — Z01818 Encounter for other preprocedural examination: Secondary | ICD-10-CM

## 2024-02-01 DIAGNOSIS — E1122 Type 2 diabetes mellitus with diabetic chronic kidney disease: Secondary | ICD-10-CM | POA: Insufficient documentation

## 2024-02-01 DIAGNOSIS — Z96641 Presence of right artificial hip joint: Secondary | ICD-10-CM | POA: Diagnosis not present

## 2024-02-01 DIAGNOSIS — I129 Hypertensive chronic kidney disease with stage 1 through stage 4 chronic kidney disease, or unspecified chronic kidney disease: Secondary | ICD-10-CM | POA: Insufficient documentation

## 2024-02-01 HISTORY — PX: TOTAL HIP ARTHROPLASTY: SHX124

## 2024-02-01 LAB — ABO/RH: ABO/RH(D): A POS

## 2024-02-01 SURGERY — ARTHROPLASTY, HIP, TOTAL, ANTERIOR APPROACH
Anesthesia: Spinal | Site: Hip | Laterality: Right

## 2024-02-01 MED ORDER — SODIUM CHLORIDE 0.9 % IR SOLN
Status: DC | PRN
  Administered 2024-02-01: 250 mL

## 2024-02-01 MED ORDER — 0.9 % SODIUM CHLORIDE (POUR BTL) OPTIME
TOPICAL | Status: DC | PRN
  Administered 2024-02-01: 500 mL

## 2024-02-01 MED ORDER — PHENYLEPHRINE 80 MCG/ML (10ML) SYRINGE FOR IV PUSH (FOR BLOOD PRESSURE SUPPORT)
PREFILLED_SYRINGE | INTRAVENOUS | Status: DC | PRN
  Administered 2024-02-01: 80 ug via INTRAVENOUS
  Administered 2024-02-01: 160 ug via INTRAVENOUS

## 2024-02-01 MED ORDER — MIDAZOLAM HCL 2 MG/2ML IJ SOLN
INTRAMUSCULAR | Status: AC
  Filled 2024-02-01: qty 2

## 2024-02-01 MED ORDER — SODIUM CHLORIDE 0.9 % IV SOLN: INTRAVENOUS | Status: DC

## 2024-02-01 MED ORDER — GLYCOPYRROLATE 0.2 MG/ML IJ SOLN
INTRAMUSCULAR | Status: DC | PRN
  Administered 2024-02-01: .2 mg via INTRAVENOUS

## 2024-02-01 MED ORDER — TRAMADOL HCL 50 MG PO TABS: 50.0000 mg | ORAL_TABLET | Freq: Four times a day (QID) | ORAL | Status: DC | PRN

## 2024-02-01 MED ORDER — DEXAMETHASONE SODIUM PHOSPHATE 10 MG/ML IJ SOLN
8.0000 mg | Freq: Once | INTRAMUSCULAR | Status: DC
  Administered 2024-02-01: 8 mg via INTRAVENOUS

## 2024-02-01 MED ORDER — PANTOPRAZOLE SODIUM 40 MG PO TBEC
40.0000 mg | DELAYED_RELEASE_TABLET | Freq: Every day | ORAL | Status: DC
Start: 2024-02-01 — End: 2024-02-02

## 2024-02-01 MED ORDER — ACETAMINOPHEN 10 MG/ML IV SOLN
INTRAVENOUS | Status: DC | PRN
  Administered 2024-02-01: 1000 mg via INTRAVENOUS

## 2024-02-01 MED ORDER — PROPOFOL 1000 MG/100ML IV EMUL
INTRAVENOUS | Status: AC
Start: 2024-02-01 — End: ?
  Filled 2024-02-01: qty 200

## 2024-02-01 MED ORDER — BUPIVACAINE HCL (PF) 0.5 % IJ SOLN
INTRAMUSCULAR | Status: DC | PRN
  Administered 2024-02-01: 3 mL

## 2024-02-01 MED ORDER — ONDANSETRON HCL 4 MG PO TABS: 4.0000 mg | ORAL_TABLET | Freq: Four times a day (QID) | ORAL | Status: DC | PRN

## 2024-02-01 MED ORDER — FENTANYL CITRATE (PF) 100 MCG/2ML IJ SOLN
INTRAMUSCULAR | Status: AC
  Filled 2024-02-01: qty 2

## 2024-02-01 MED ORDER — DROPERIDOL 2.5 MG/ML IJ SOLN: 0.6250 mg | Freq: Once | INTRAMUSCULAR | Status: DC | PRN

## 2024-02-01 MED ORDER — OXYCODONE HCL 5 MG/5ML PO SOLN: 5.0000 mg | Freq: Once | ORAL | Status: DC | PRN

## 2024-02-01 MED ORDER — CHLORHEXIDINE GLUCONATE 0.12 % MT SOLN
OROMUCOSAL | Status: AC
  Filled 2024-02-01: qty 15

## 2024-02-01 MED ORDER — GLYCOPYRROLATE 0.2 MG/ML IJ SOLN
INTRAMUSCULAR | Status: AC
  Filled 2024-02-01: qty 1

## 2024-02-01 MED ORDER — PHENOL 1.4 % MT LIQD
1.0000 | OROMUCOSAL | Status: DC | PRN
Start: 2024-02-01 — End: 2024-02-02

## 2024-02-01 MED ORDER — ONDANSETRON HCL 4 MG/2ML IJ SOLN
4.0000 mg | Freq: Four times a day (QID) | INTRAMUSCULAR | Status: DC | PRN
Start: 2024-02-01 — End: 2024-02-02

## 2024-02-01 MED ORDER — DOCUSATE SODIUM 100 MG PO CAPS
100.0000 mg | ORAL_CAPSULE | Freq: Two times a day (BID) | ORAL | Status: DC
Start: 2024-02-01 — End: 2024-02-02
  Administered 2024-02-02: 100 mg via ORAL
  Filled 2024-02-01 (×2): qty 1

## 2024-02-01 MED ORDER — ACETAMINOPHEN 10 MG/ML IV SOLN: 1000.0000 mg | Freq: Once | INTRAVENOUS | Status: DC | PRN

## 2024-02-01 MED ORDER — ACETAMINOPHEN 500 MG PO TABS
1000.0000 mg | ORAL_TABLET | Freq: Three times a day (TID) | ORAL | Status: DC
  Administered 2024-02-01 – 2024-02-02 (×2): 1000 mg via ORAL
  Filled 2024-02-01 (×3): qty 2

## 2024-02-01 MED ORDER — CEFAZOLIN SODIUM-DEXTROSE 2-4 GM/100ML-% IV SOLN
INTRAVENOUS | Status: AC
  Filled 2024-02-01: qty 100

## 2024-02-01 MED ORDER — TRANEXAMIC ACID-NACL 1000-0.7 MG/100ML-% IV SOLN
INTRAVENOUS | Status: AC
  Filled 2024-02-01: qty 100

## 2024-02-01 MED ORDER — OXYCODONE HCL 5 MG PO TABS: 5.0000 mg | ORAL_TABLET | Freq: Once | ORAL | Status: DC | PRN

## 2024-02-01 MED ORDER — ACETAMINOPHEN 325 MG PO TABS: 325.0000 mg | ORAL_TABLET | Freq: Four times a day (QID) | ORAL | Status: DC | PRN

## 2024-02-01 MED ORDER — ENOXAPARIN SODIUM 40 MG/0.4ML IJ SOSY
40.0000 mg | PREFILLED_SYRINGE | INTRAMUSCULAR | Status: DC
  Administered 2024-02-02: 40 mg via SUBCUTANEOUS
  Filled 2024-02-01: qty 0.4

## 2024-02-01 MED ORDER — HYDROCODONE-ACETAMINOPHEN 5-325 MG PO TABS
1.0000 | ORAL_TABLET | ORAL | Status: DC | PRN
  Administered 2024-02-01 – 2024-02-02 (×2): 2 via ORAL
  Filled 2024-02-01: qty 2
  Filled 2024-02-01: qty 1
  Filled 2024-02-01: qty 2

## 2024-02-01 MED ORDER — CEFAZOLIN SODIUM-DEXTROSE 2-4 GM/100ML-% IV SOLN
2.0000 g | INTRAVENOUS | Status: AC
  Administered 2024-02-01: 2 g via INTRAVENOUS

## 2024-02-01 MED ORDER — FENTANYL CITRATE (PF) 100 MCG/2ML IJ SOLN: 25.0000 ug | INTRAMUSCULAR | Status: DC | PRN

## 2024-02-01 MED ORDER — BUPIVACAINE-EPINEPHRINE (PF) 0.25% -1:200000 IJ SOLN
INTRAMUSCULAR | Status: AC
  Filled 2024-02-01: qty 30

## 2024-02-01 MED ORDER — MENTHOL 3 MG MT LOZG: 1.0000 | LOZENGE | OROMUCOSAL | Status: DC | PRN

## 2024-02-01 MED ORDER — ORAL CARE MOUTH RINSE: 15.0000 mL | Freq: Once | OROMUCOSAL | Status: AC

## 2024-02-01 MED ORDER — PHENYLEPHRINE HCL-NACL 20-0.9 MG/250ML-% IV SOLN
INTRAVENOUS | Status: DC | PRN
  Administered 2024-02-01: 60 ug/min via INTRAVENOUS
  Administered 2024-02-01: 40 ug/min via INTRAVENOUS

## 2024-02-01 MED ORDER — PROPOFOL 10 MG/ML IV BOLUS
INTRAVENOUS | Status: DC | PRN
  Administered 2024-02-01 (×2): 30 mg via INTRAVENOUS

## 2024-02-01 MED ORDER — CHLORHEXIDINE GLUCONATE 0.12 % MT SOLN
15.0000 mL | Freq: Once | OROMUCOSAL | Status: AC
  Administered 2024-02-01: 15 mL via OROMUCOSAL

## 2024-02-01 MED ORDER — ROSUVASTATIN CALCIUM 10 MG PO TABS: 20.0000 mg | ORAL_TABLET | Freq: Every day | ORAL | Status: DC

## 2024-02-01 MED ORDER — PHENYLEPHRINE HCL-NACL 20-0.9 MG/250ML-% IV SOLN
INTRAVENOUS | Status: AC
  Filled 2024-02-01: qty 250

## 2024-02-01 MED ORDER — METOCLOPRAMIDE HCL 5 MG PO TABS: 5.0000 mg | ORAL_TABLET | Freq: Three times a day (TID) | ORAL | Status: DC | PRN

## 2024-02-01 MED ORDER — BUPIVACAINE LIPOSOME 1.3 % IJ SUSP
INTRAMUSCULAR | Status: AC
  Filled 2024-02-01: qty 20

## 2024-02-01 MED ORDER — AMLODIPINE BESYLATE 10 MG PO TABS
10.0000 mg | ORAL_TABLET | ORAL | Status: DC
  Administered 2024-02-02: 10 mg via ORAL

## 2024-02-01 MED ORDER — LIDOCAINE HCL (PF) 2 % IJ SOLN
INTRAMUSCULAR | Status: AC
  Filled 2024-02-01: qty 5

## 2024-02-01 MED ORDER — DEXMEDETOMIDINE HCL IN NACL 80 MCG/20ML IV SOLN
INTRAVENOUS | Status: DC | PRN
  Administered 2024-02-01: 8 ug via INTRAVENOUS

## 2024-02-01 MED ORDER — PROPOFOL 500 MG/50ML IV EMUL
INTRAVENOUS | Status: DC | PRN
  Administered 2024-02-01: 125 ug/kg/min via INTRAVENOUS
  Administered 2024-02-01: 80 ug/kg/min via INTRAVENOUS

## 2024-02-01 MED ORDER — SODIUM CHLORIDE (PF) 0.9 % IJ SOLN
INTRAMUSCULAR | Status: AC
  Filled 2024-02-01: qty 10

## 2024-02-01 MED ORDER — TRANEXAMIC ACID-NACL 1000-0.7 MG/100ML-% IV SOLN
1000.0000 mg | INTRAVENOUS | Status: AC
  Administered 2024-02-01 (×2): 1000 mg via INTRAVENOUS

## 2024-02-01 MED ORDER — ACETAMINOPHEN 10 MG/ML IV SOLN
INTRAVENOUS | Status: AC
  Filled 2024-02-01: qty 100

## 2024-02-01 MED ORDER — METOCLOPRAMIDE HCL 5 MG/ML IJ SOLN: 5.0000 mg | Freq: Three times a day (TID) | INTRAMUSCULAR | Status: DC | PRN

## 2024-02-01 MED ORDER — FENTANYL CITRATE (PF) 100 MCG/2ML IJ SOLN
INTRAMUSCULAR | Status: DC | PRN
  Administered 2024-02-01 (×2): 50 ug via INTRAVENOUS

## 2024-02-01 MED ORDER — MIDAZOLAM HCL 5 MG/5ML IJ SOLN
INTRAMUSCULAR | Status: DC | PRN
  Administered 2024-02-01: 2 mg via INTRAVENOUS

## 2024-02-01 MED ORDER — FERROUS SULFATE 325 (65 FE) MG PO TABS
324.0000 mg | ORAL_TABLET | Freq: Every day | ORAL | Status: DC
  Administered 2024-02-02: 324 mg via ORAL
  Filled 2024-02-01: qty 1

## 2024-02-01 MED ORDER — SURGIPHOR WOUND IRRIGATION SYSTEM - OPTIME: TOPICAL | Status: DC | PRN

## 2024-02-01 MED ORDER — MORPHINE SULFATE (PF) 4 MG/ML IV SOLN
0.5000 mg | INTRAVENOUS | Status: DC | PRN
  Administered 2024-02-01: 1 mg via INTRAVENOUS
  Filled 2024-02-01: qty 1

## 2024-02-01 MED ORDER — EPHEDRINE SULFATE-NACL 50-0.9 MG/10ML-% IV SOSY
PREFILLED_SYRINGE | INTRAVENOUS | Status: DC | PRN
  Administered 2024-02-01: 5 mg via INTRAVENOUS
  Administered 2024-02-01: 10 mg via INTRAVENOUS
  Administered 2024-02-01: 5 mg via INTRAVENOUS

## 2024-02-01 MED ORDER — CEFAZOLIN SODIUM-DEXTROSE 2-4 GM/100ML-% IV SOLN
2.0000 g | Freq: Four times a day (QID) | INTRAVENOUS | Status: AC
  Administered 2024-02-01 (×2): 2 g via INTRAVENOUS
  Filled 2024-02-01 (×2): qty 100

## 2024-02-01 MED ORDER — SODIUM CHLORIDE (PF) 0.9 % IJ SOLN
INTRAMUSCULAR | Status: DC | PRN
  Administered 2024-02-01: 50 mL

## 2024-02-01 MED ORDER — LIDOCAINE HCL (CARDIAC) PF 100 MG/5ML IV SOSY
PREFILLED_SYRINGE | INTRAVENOUS | Status: DC | PRN
Start: 2024-02-01 — End: 2024-02-01
  Administered 2024-02-01: 100 mg via INTRAVENOUS

## 2024-02-01 SURGICAL SUPPLY — 59 items
BLADE CLIPPER SURG (BLADE) IMPLANT
BLADE SAGITTAL AGGR TOOTH XLG (BLADE) ×1 IMPLANT
BNDG COHESIVE 6X5 TAN ST LF (GAUZE/BANDAGES/DRESSINGS) ×2 IMPLANT
BRUSH SCRUB EZ PLAIN DRY (MISCELLANEOUS) ×1 IMPLANT
CHLORAPREP W/TINT 26 (MISCELLANEOUS) ×1 IMPLANT
DERMABOND ADVANCED .7 DNX12 (GAUZE/BANDAGES/DRESSINGS) ×1 IMPLANT
DRAPE C-ARM XRAY 36X54 (DRAPES) ×1 IMPLANT
DRAPE SHEET LG 3/4 BI-LAMINATE (DRAPES) ×2 IMPLANT
DRAPE TABLE BACK 80X90 (DRAPES) ×1 IMPLANT
DRSG MEPILEX SACRM 8.7X9.8 (GAUZE/BANDAGES/DRESSINGS) ×1 IMPLANT
DRSG OPSITE POSTOP 4X8 (GAUZE/BANDAGES/DRESSINGS) ×1 IMPLANT
ELECTRODE BLDE 4.0 EZ CLN MEGD (MISCELLANEOUS) ×1 IMPLANT
ELECTRODE REM PT RTRN 9FT ADLT (ELECTROSURGICAL) ×1 IMPLANT
GLOVE BIO SURGEON STRL SZ8 (GLOVE) ×1 IMPLANT
GLOVE BIOGEL PI IND STRL 8 (GLOVE) ×1 IMPLANT
GLOVE PI ORTHO PRO STRL 7.5 (GLOVE) ×2 IMPLANT
GLOVE PI ORTHO PRO STRL SZ8 (GLOVE) ×2 IMPLANT
GLOVE SURG SYN 7.5 E (GLOVE) ×1 IMPLANT
GLOVE SURG SYN 7.5 PF PI (GLOVE) ×1 IMPLANT
GOWN SRG XL LVL 3 NONREINFORCE (GOWNS) ×1 IMPLANT
GOWN STRL REUS W/ TWL LRG LVL3 (GOWN DISPOSABLE) ×1 IMPLANT
GOWN STRL REUS W/ TWL XL LVL3 (GOWN DISPOSABLE) ×1 IMPLANT
HEAD FEM TAPER UNIV 4 (Orthopedic Implant) IMPLANT
HOOD PEEL AWAY T7 (MISCELLANEOUS) ×2 IMPLANT
INSERT 0D POLY 40 SZ F (Insert) IMPLANT
IV NS 100ML SINGLE PACK (IV SOLUTION) ×1 IMPLANT
KIT PATIENT CARE HANA TABLE (KITS) ×1 IMPLANT
KIT TURNOVER CYSTO (KITS) ×1 IMPLANT
LIGHT WAVEGUIDE WIDE FLAT (MISCELLANEOUS) ×1 IMPLANT
MANIFOLD NEPTUNE II (INSTRUMENTS) ×1 IMPLANT
MARKER SKIN DUAL TIP RULER LAB (MISCELLANEOUS) ×1 IMPLANT
MAT ABSORB FLUID 56X50 GRAY (MISCELLANEOUS) ×1 IMPLANT
NDL SPNL 20GX3.5 QUINCKE YW (NEEDLE) ×1 IMPLANT
NEEDLE SPNL 20GX3.5 QUINCKE YW (NEEDLE) ×1 IMPLANT
NS IRRIG 500ML POUR BTL (IV SOLUTION) ×1 IMPLANT
PACK HIP COMPR (MISCELLANEOUS) ×1 IMPLANT
PAD ARMBOARD POSITIONER FOAM (MISCELLANEOUS) ×1 IMPLANT
PENCIL SMOKE EVACUATOR (MISCELLANEOUS) ×1 IMPLANT
SCREW HEX LP 6.5X20 (Screw) IMPLANT
SCREW HEX LP 6.5X30 (Screw) IMPLANT
SHELL TRIDENT II CLUST SZ 56MM (Shell) IMPLANT
SLEEVE SCD COMPRESS KNEE MED (STOCKING) ×1 IMPLANT
SLEEVE UNIV TAPER ADAPTER -2.5 (Stem) IMPLANT
SOLUTION IRRIG SURGIPHOR (IV SOLUTION) ×1 IMPLANT
STEM STD OFFSET SZ7 38 (Stem) IMPLANT
SURGIFLO W/THROMBIN 8M KIT (HEMOSTASIS) IMPLANT
SUT BONE WAX W31G (SUTURE) ×1 IMPLANT
SUT ETHIBOND 2 V 37 (SUTURE) ×1 IMPLANT
SUT SILK 0 30XBRD TIE 6 (SUTURE) ×1 IMPLANT
SUT STRATAFIX 14 PDO 36 VLT (SUTURE) ×1 IMPLANT
SUT VIC AB 0 CT1 36 (SUTURE) ×1 IMPLANT
SUT VIC AB 2-0 CT2 27 (SUTURE) ×1 IMPLANT
SUTURE STRATA SPIR 4-0 18 (SUTURE) ×1 IMPLANT
SYR 20ML LL LF (SYRINGE) ×2 IMPLANT
TAPE MICROFOAM 4IN (TAPE) IMPLANT
TOWEL OR 17X26 4PK STRL BLUE (TOWEL DISPOSABLE) IMPLANT
TRAP FLUID SMOKE EVACUATOR (MISCELLANEOUS) ×1 IMPLANT
WAND WEREWOLF FASTSEAL 6.0 (MISCELLANEOUS) ×1 IMPLANT
WATER STERILE IRR 1000ML POUR (IV SOLUTION) ×1 IMPLANT

## 2024-02-01 NOTE — Plan of Care (Signed)
  Problem: Health Behavior/Discharge Planning: Goal: Ability to manage health-related needs will improve Outcome: Progressing   Problem: Activity: Goal: Risk for activity intolerance will decrease Outcome: Progressing   Problem: Nutrition: Goal: Adequate nutrition will be maintained Outcome: Progressing   Problem: Pain Managment: Goal: General experience of comfort will improve and/or be controlled Outcome: Progressing   Problem: Safety: Goal: Ability to remain free from injury will improve Outcome: Progressing   Problem: Skin Integrity: Goal: Risk for impaired skin integrity will decrease Outcome: Progressing

## 2024-02-01 NOTE — H&P (Signed)
 History of Present Illness: Martin Church is an 68 y.o. male presents for evaluation of his right hip. Patient reports ports pain began about 7 months ago without any specific known injury. He reports pain in his deep right groin radiating through his hip to the lateral side of the leg. No radiation down the leg no associated numbness or tingling. He describes the pain as a sharp shooting pain which turns into a constant ache with activity and walking. He spent years in the [employer] and was in the airport jumping out of planes so he is unsure if he had any major injuries to the hip in the past. Denies any left hip pain at this time. He reports the pain is limiting his ability to walk or sit and causes severe limping with ambulation. He is here today to discuss more permanent solution for his hip he was seen evaluated and offered a right hip injection but is not interested in any temporizing measures for the hip at this point. The patient denies fevers, chills, numbness, tingling, shortness of breath, chest pain, recent illness, or any trauma.  Patient is a BMI of 29.6 is a non-smoker and a nondiabetic with an A1c of 5.6  Past Medical History: Past Medical History:  Diagnosis Date  Colon polyp 04/17/14  tubular adenoma  Erectile dysfunction 2006  Hyperlipidemia  Hypogonadism in male  Tubular adenoma of colon 05/17/08  Transverse colon  Vitamin D deficiency   Past Surgical History: Past Surgical History:  Procedure Laterality Date  COLONOSCOPY 04/17/2014  Dr. Zackary Heron @ [facility] - (05/17/2008) Multi. Adenomatous Polyps, rpt 3 yrs per [facility]  COLONOSCOPY 06/28/2021  Tubular adenomas/PHx CP/Repeat 43yrs/[facility]  APPENDECTOMY  TESTICULAR PROSTHESIS INSERTION   Past Family History: Family History  Problem Relation Age of Onset  No Known Problems Mother  No Known Problems Father  Aneurysm Brother   Medications: Current Outpatient Medications  Medication Sig Dispense Refill  amLODIPine   (NORVASC ) 10 MG tablet Take 10 mg by mouth once daily  diclofenac (VOLTAREN) 1 % topical gel Apply topically  ferrous sulfate 325 (65 FE) MG tablet Take 325 mg by mouth daily with breakfast.  ketoconazole (NIZORAL) 2 % shampoo Apply topically once Use 3-4 times per week. Leave on for 5 minutes, then rinse.  losartan -hydroCHLOROthiazide  (HYZAAR ) 100-12.5 mg tablet Take 1 tablet by mouth once daily 90 tablet 11  multivitamin tablet Take 1 tablet by mouth once daily.  predniSONE  (DELTASONE ) 10 MG tablet Take 6 tabs (60mg ) by mouth followed by 5 tabs (50mg ), then 4 tabs (40mg ), then 3 tabs (30mg ), then 2 tabs (20mg ), then 1 tab (10mg ) 21 tablet 0  rosuvastatin  (CRESTOR ) 20 MG tablet Take 1 tablet (20 mg total) by mouth once daily 90 tablet 3  sildenafil  (REVATIO ) 20 mg tablet Take 2-5 tablets (40-100 mg total) by mouth once daily as needed 20 tablet 5  terbinafine HCL (LAMISIL) 250 mg tablet Take 250 mg by mouth once daily   No current facility-administered medications for this visit.   Allergies: No Known Allergies   Visit Vitals: Vitals:  01/13/24 1034  BP: (!) 140/90    Review of Systems:  A comprehensive 14 point ROS was performed, reviewed, and the pertinent orthopaedic findings are documented in the HPI.  Physical Exam: Body mass index is 29.66 kg/m. General/Constitutional: No apparent distress: well-nourished and well developed. Lymphatic: No palpable adenopathy. Pulmonary exam: Lungs clear to auscultation bilaterally no wheezing rales or rhonchi Cardiac exam: Regular rate and rhythm no obvious  murmurs rubs or gallops. Vascular: No edema, swelling or tenderness, except as noted in detailed exam. Integumentary: No impressive skin lesions present, except as noted in detailed exam. Neuro/Psych: Normal mood and affect, oriented to person, place and time. Musculoskeletal: Normal, except as noted in detailed exam and in HPI.  Right hip exam  SKIN: intact SWELLING: none WARMTH:  no warmth TENDERNESS: none, Stinchfield Positive ROM: 0 degrees internal rotation and 30 degrees external rotation and pain with internal rotation, external Tatian and hip flexion localized to the deep groin,; Hip Flexion 80 STRENGTH: limited by pain GAIT: antalgic and stiff-legged STABILITY: stable to testing CREPITUS: no LEG LENGTH DISCREPANCY: left longer by .2 cm NEUROLOGICAL EXAM: normal VASCULAR EXAM: normal LUMBAR SPINE: tenderness: no straight leg raising sign: no motor exam: normal  The contralateral hip was examined for comparison and it showed: TENDERNESS: none ROM: normal and full STRENGTH: normal STABILITY: stable to testing  Hip Imaging :  I have reviewed AP pelvis hip X-rays (2 views) taken today in the office of the right hip which reveal moderate to severe degenerative changes with a large cam deformity and joint space narrowing with bone-on-bone articulation, sclerosis, osteophyte formation and early subchondral cyst formation. The left hip shows moderate degenerative changes with sclerosis and joint space narrowing and patient. No fractures or dislocations noted about either hip or the pelvis.   Assessment:  Encounter Diagnosis  Name Primary?  Primary osteoarthritis of right hip Yes   Plan: Martin Church is a 68 year old male presents with right hip bone on bone arthritis. We have discussed additional conservative measures including prescription medications and he would like to try his meloxicam  however he is not interested in any temporizing shots or additional solutions. Based upon the patient's continued symptoms and failure to respond to conservative treatment and a preference to avoid temporary fixes, I have recommended a right total hip replacement for this patient. A long discussion took place with the patient describing what a total joint replacement is and what the procedure would entail. A hip model, similar to the implants that will be used during the operation,  was utilized to demonstrate the implants. Choices of implant manufactures were discussed and reviewed. The ability to secure the implant utilizing cement or cementless (press fit) fixation was discussed. Anterior and posterior exposures were discussed. For this patient an appropriate approach will be anterior.   The hospitalization and post-operative care and rehabilitation were also discussed. The use of perioperative antibiotics and DVT prophylaxis were discussed. The risk, benefits and alternatives to a surgical intervention were discussed at length with the patient. The patient was also advised of risks related to the medical comorbidities and elevated body mass index (BMI). A lengthy discussion took place to review the most common complications including but not limited to: deep vein thrombosis, pulmonary embolus, heart attack, stroke, infection, wound breakdown, heterotopic ossification, dislocation, numbness, leg length in-equality, intraoperative fracture, damage to nerves, tendon,muscles, arteries or other blood vessels, death and other possible complications from anesthesia. The patient was told that we will take steps to minimize these risks by using sterile technique, antibiotics and DVT prophylaxis when appropriate and follow the patient postoperatively in the office setting to monitor progress. The possibility of recurrent pain, no improvement in pain and actual worsening of pain were also discussed with the patient. The risk of dislocation following total hip replacement was discussed and potential precautions to prevent dislocation were reviewed.   Patient asked about and confirms no history of any reactions to  metal or metal allergy in the past.  The discharge plan of care focused on the patient going home following surgery. The patient was encouraged to make the necessary arrangements to have someone stay with them when they are discharged home.   The benefits of surgery were discussed with  the patient including the potential for improving the patient's current clinical condition through operative intervention. Alternatives to surgical intervention including continued conservative management were also discussed in detail. All questions were answered to the satisfaction of the patient. The patient participated and agreed to the plan of care as well as the use of the recommended implants for their total hip replacement surgery. An information packet was given to the patient to review prior to surgery.   The patient received clearance for surgery. All questions answered and patient agrees above plan for a right anterior total hip replacement  Portions of this record have been created using Dragon dictation software. Dictation errors have been sought, but may not have been identified and corrected.  Venus Ginsberg MD

## 2024-02-01 NOTE — Anesthesia Preprocedure Evaluation (Addendum)
 Anesthesia Evaluation  Patient identified by MRN, date of birth, ID band Patient awake    Reviewed: Allergy & Precautions, H&P , NPO status , Patient's Chart, lab work & pertinent test results  Airway Mallampati: III  TM Distance: >3 FB Neck ROM: full    Dental   Pulmonary neg pulmonary ROS   Pulmonary exam normal        Cardiovascular hypertension, Normal cardiovascular exam     Neuro/Psych negative neurological ROS  negative psych ROS   GI/Hepatic negative GI ROS, Neg liver ROS,,,  Endo/Other  diabetes    Renal/GU Renal InsufficiencyRenal disease     Musculoskeletal   Abdominal  (+) + obese  Peds  Hematology negative hematology ROS (+)   Anesthesia Other Findings Past Medical History: No date: CKD (chronic kidney disease) stage 3, GFR 30-59 ml/min (HCC) No date: Diet-controlled type 2 diabetes mellitus (HCC) No date: ED (erectile dysfunction) No date: HLD (hyperlipidemia) No date: Hypertension No date: Osteoarthritis of right hip No date: Tubular adenoma of colon No date: Vitamin D deficiency  Past Surgical History: No date: APPENDECTOMY     Comment:  age 68 No date: COLONOSCOPY 06/28/2021: COLONOSCOPY WITH PROPOFOL ; N/A     Comment:  Procedure: COLONOSCOPY WITH PROPOFOL ;  Surgeon:               Shane Darling, MD;  Location: [facility] ENDOSCOPY;                Service: Endoscopy;  Laterality: N/A;  BMI    Body Mass Index: 30.61 kg/m      Reproductive/Obstetrics negative OB ROS                             Anesthesia Physical Anesthesia Plan  ASA: 2  Anesthesia Plan: Spinal   Post-op Pain Management: Ofirmev  IV (intra-op)* and Regional block*   Induction: Intravenous  PONV Risk Score and Plan: Propofol  infusion  Airway Management Planned: Natural Airway  Additional Equipment:   Intra-op Plan:   Post-operative Plan:   Informed Consent: I have reviewed the  patients History and Physical, chart, labs and discussed the procedure including the risks, benefits and alternatives for the proposed anesthesia with the patient or authorized representative who has indicated his/her understanding and acceptance.     Dental Advisory Given  Plan Discussed with: CRNA and Surgeon  Anesthesia Plan Comments:         Anesthesia Quick Evaluation

## 2024-02-01 NOTE — Evaluation (Signed)
 Physical Therapy Evaluation Patient Details Name: Martin Church MRN: [id number] DOB: October 02, 1955 Today's Date: 02/01/2024  History of Present Illness  Pt is a 68 yo male s/p R THA. PMH of HTN, DM, CKD, HLD.  Clinical Impression  Pt A&Ox4, reported 7/10 R hip pain that he stated improved over the session. Per pt at baseline he is modI with cane, lives with family, performs ADLs. He was able to perform a few supine R therex with verbal cues. Supine to sit with supervision, extra time. Sit <> stand from bed CGA with RW, cues for hand placement. Able to ambulate ~36ft total, stand and void (RN updated) and return to recliner in room, RW/CGA. initially step to gait, able to improve to step through and cues for upright posture. Did appear orthostatic after ambulation, BP 92/67 in recliner after a seated rest break.  Overall the patient demonstrated deficits (see "PT Problem List") that impede the patient's functional abilities, safety, and mobility and would benefit from skilled PT intervention.        If plan is discharge home, recommend the following: A little help with walking and/or transfers;A little help with bathing/dressing/bathroom;Assistance with cooking/housework;Assist for transportation;Help with stairs or ramp for entrance   Can travel by private vehicle        Equipment Recommendations None recommended by PT (pt reported having RW and BSC at home)  Recommendations for Other Services       Functional Status Assessment Patient has had a recent decline in their functional status and demonstrates the ability to make significant improvements in function in a reasonable and predictable amount of time.     Precautions / Restrictions Precautions Precautions: Fall;Anterior Hip Precaution Booklet Issued: Yes (comment) Recall of Precautions/Restrictions: Intact Restrictions Weight Bearing Restrictions Per Provider Order: Yes RLE Weight Bearing Per Provider Order: Weight bearing as  tolerated      Mobility  Bed Mobility Overal bed mobility: Needs Assistance Bed Mobility: Supine to Sit     Supine to sit: Supervision          Transfers Overall transfer level: Needs assistance Equipment used: Rolling walker (2 wheels) Transfers: Sit to/from Stand Sit to Stand: Contact guard assist                Ambulation/Gait Ambulation/Gait assistance: Contact guard assist Gait Distance (Feet): 80 Feet (78ft to/from bathroom,additional ambulation in hallway) Assistive device: Rolling walker (2 wheels)   Gait velocity: decreased     General Gait Details: initially step to gait, able to improve to step through and cues for upright posture. did appear orthostatic after ambulation, BP 92/67 in recliner after a seated rest break  Stairs            Wheelchair Mobility     Tilt Bed    Modified Rankin (Stroke Patients Only)       Balance Overall balance assessment: Needs assistance Sitting-balance support: Feet supported Sitting balance-Leahy Scale: Good     Standing balance support: Bilateral upper extremity supported, During functional activity Standing balance-Leahy Scale: Fair                               Pertinent Vitals/Pain Pain Assessment Pain Assessment: 0-10 Pain Score: 7  Pain Location: R hip Pain Descriptors / Indicators: Aching, Sore Pain Intervention(s): Limited activity within patient's tolerance, Monitored during session, Repositioned    Home Living Family/patient expects to be discharged to:: Private residence Living Arrangements: Spouse/significant  other   Type of Home: House Home Access: Stairs to enter Entrance Stairs-Rails: Can reach both Entrance Stairs-Number of Steps: 5, or 1+1 ( no rails)   Home Layout: One level Home Equipment: Agricultural consultant (2 wheels);Cane - quad;BSC/3in1;Grab bars - toilet      Prior Function Prior Level of Function : Independent/Modified Independent;Driving              Mobility Comments: reported use of 3 point cane       Extremity/Trunk Assessment   Upper Extremity Assessment Upper Extremity Assessment: Overall WFL for tasks assessed    Lower Extremity Assessment Lower Extremity Assessment: Overall WFL for tasks assessed (able to lift BLE against gravity)       Communication        Cognition Arousal: Alert Behavior During Therapy: WFL for tasks assessed/performed   PT - Cognitive impairments: No apparent impairments                         Following commands: Intact       Cueing       General Comments      Exercises     Assessment/Plan    PT Assessment Patient needs continued PT services  PT Problem List Decreased strength;Pain;Decreased range of motion;Decreased activity tolerance;Decreased knowledge of use of DME;Decreased balance;Decreased mobility;Decreased knowledge of precautions       PT Treatment Interventions DME instruction;Balance training;Gait training;Neuromuscular re-education;Stair training;Functional mobility training;Patient/family education;Therapeutic activities;Therapeutic exercise    PT Goals (Current goals can be found in the Care Plan section)  Acute Rehab PT Goals Patient Stated Goal: to go home PT Goal Formulation: With family Time For Goal Achievement: 02/15/24 Potential to Achieve Goals: Good    Frequency BID     Co-evaluation               AM-PAC PT "6 Clicks" Mobility  Outcome Measure Help needed turning from your back to your side while in a flat bed without using bedrails?: A Little Help needed moving from lying on your back to sitting on the side of a flat bed without using bedrails?: A Little Help needed moving to and from a bed to a chair (including a wheelchair)?: A Little Help needed standing up from a chair using your arms (e.g., wheelchair or bedside chair)?: A Little Help needed to walk in hospital room?: A Little Help needed climbing 3-5 steps with a  railing? : A Little 6 Click Score: 18    End of Session Equipment Utilized During Treatment: Gait belt Activity Tolerance: Patient tolerated treatment well Patient left: in chair;with call bell/phone within reach Nurse Communication: Mobility status PT Visit Diagnosis: Other abnormalities of gait and mobility (R26.89);Difficulty in walking, not elsewhere classified (R26.2);Muscle weakness (generalized) (M62.81);Pain Pain - Right/Left: Right Pain - part of body: Hip    Time: 1610-9604 PT Time Calculation (min) (ACUTE ONLY): 27 min   Charges:   PT Evaluation $PT Eval Low Complexity: 1 Low PT Treatments $Therapeutic Activity: 23-37 mins PT General Charges $$ ACUTE PT VISIT: 1 Visit        Darien Eden PT, DPT 3:07 PM,02/01/24

## 2024-02-01 NOTE — Interval H&P Note (Signed)
 Patient history and physical updated. Consent reviewed including risks, benefits, and alternatives to surgery. Patient agrees with above plan to proceed with right anterior total hip arthroplasty.

## 2024-02-01 NOTE — Op Note (Signed)
 Patient Name: Martin Church  ZOX:[id number]  Pre-Operative Diagnosis: Right hip Osteoarthritis  Post-Operative Diagnosis: (same)  Procedure: Right Total Hip Arthroplasty  Components/Implants: Cup: Trident Tritanium Clusterhole 56/F w/ x2 screws    Liner: Neutral X3 poly 40/F  Stem: Insignia #7 std offset  Head:Biolox ceramic 40mm with -2.24mm Taper adapter sleeve  Date of Surgery: 02/01/2024  Surgeon: Venus Ginsberg MD  Assistant: Francenia Ingle PA (present and scrubbed throughout the case, critical for assistance with exposure, retraction, instrumentation, and closure), Sarah PAs   Anesthesiologist: Lincoln Renshaw  Anesthesia: Spinal   EBL: 150cc  IVF:800cc  Complications: None   Brief history: The patient is a 68 year old male with a history of osteoarthritis of the right hip with pain limiting their range of motion and activities of daily living, which has failed multiple attempts at conservative therapy.  The risks and benefits of total hip arthroplasty as definitive surgical treatment were discussed with the patient, who opted to proceed with the operation.  After outpatient medical clearance and optimization was completed the patient was admitted to [facility] for the procedure.  All preoperative films were reviewed and an appropriate surgical plan was made prior to surgery.   Description of procedure: The patient was brought to the operating room where laterality was confirmed by all those present to be the right side.  The patient was administered spinal anesthesia on a stretcher prior to being moved supine on the operating room table. Patient was given an intravenous dose of antibiotics for surgical prophylaxis and TXA.  All bony prominences and extremities were well padded and the patient was securely attached to the table boots, a perineal post was placed and the patient had a safety strap placed.  Surgical site was prepped with alcohol and chlorhexidine . The  surgical site over the hip was and draped in typical sterile fashion with multiple layers of adhesive and nonadhesive drapes.  The incision site was marked out with a sterile marker and care was taken to assess the position of the ASIS and ensure appropriate position for the incision.    A surgical timeout was then called with participation of all staff in the room the patient was then a confirmed again and laterality confirmed.  Incision was made over the anterior lateral aspect of the proximal thigh in line with the TFL.  Appropriate retractors were placed and all bleeding vessels were coagulated within the subcutaneous and fatty layers.  An incision was made in the TFL fascia in the interval was carefully identified.  The lateral ascending branches of the circumflex vessels were identified, cauterized and carefully dissected. The main vessels were then tied with a 0 silk hand tie.  Retractors were placed around the superior lateral and inferior medial aspects of the femoral neck and a capsulotomy was performed exposing the hip joint.  Retraction stitches were placed and the capsulotomy to assist with visualization.  Femoral neck cut was then made and the femoral head was extracted after placing the leg in traction.  Bone wax was then applied to the proximal cut surface of the femur and aqua mantis was used to address any bleeding around the femoral neck cut.  Retractors were then placed around the acetabulum to fully visualize the joint space, and the remaining labral tissue was removed and pulvinar was removed.   The acetabulum was then sequentially reamed up to the appropriate size in order to get good fit and fill for the acetabular component while under fluoroscopic guidance.  Acetabular component  was then placed and malleted into a secure fit while confirming position and abduction angle and anteversion utilizing fluoroscopy.  2 screws were then placed in the acetabular cup to assist in securing the cup  in place. The cup was irrigated,  a real neutral liner was placed, impacted, and checked for stability. The femur traction was dropped and sequentially externally rotated while performing a release of the posterior and superomedial tissues off of the proximal femur to allow for mobility, care was taken to preserve the external rotators and piriformis attachments.  The remaining interval between the abductors and the capsule was dissected out and a retractor was placed over the superolateral aspect of the femur over the greater trochanter.  The leg was carefully brought down into extension and adducted to provide visualization of the proximal femur for broaching.  The femur was then sequentially broached up to an appropriate size which provided for good fill and stability to the femoral broach.  A trial neck and head were placed on the femoral broach and the leg was brought up for reduction.  The hip was reduced and manual check of stability was performed.  The hip was found to be stable in flexion internal rotation and extension external rotation.  Leg lengths were confirmed on fluoroscopy.   The hip was then dislocated the trial neck and head were removed.  The leg was then brought down into extension and adduction in the proximal femur was reexposed.  The broach trial was removed and the femur was irrigated with normal saline prior to the real femoral stem being implanted.  After the femoral stem was seated and shown to have good fit and fill the appropriate head was impacted the leg was brought up and reduced.  There was good range of motion with stability in flexion internal rotation and extension external rotation on testing.  Leg lengths were found to be appropriate on fluoroscopic evaluation at this time.  The hip was then irrigated with betdine based surgiphor solution and then saline solution.  The capsulotomy was repaired with Ethibond sutures.  A pericapsular and peritrochanteric cocktail with Exparel  and bupivacaine was then injected as well as the subcutaneous tissues. The fascia was closed with a #1 barbed running suture.  The deep tissues were closed with Vicryl sutures the subcutaneous tissues were closed with interrupted Vicryl sutures and a running barbed 4-0 suture.  The skin was then reinforced with Dermabond and a sterile dressing was placed.   The patient was awoken from anesthesia transferred off of the operating room table onto a hospital bed where examination of leg lengths found the leg lengths to be equal with a good distal pulse.  The patient was then transferred to the PACU in stable condition.

## 2024-02-01 NOTE — Anesthesia Procedure Notes (Signed)
 Spinal  Patient location during procedure: OR Start time: 02/01/2024 7:25 AM End time: 02/01/2024 7:40 AM Reason for block: surgical anesthesia Staffing Performed: anesthesiologist and resident/CRNA  Anesthesiologist: Baltazar Bonier, MD Resident/CRNA: Angelia Kelp, CRNA Performed by: Angelia Kelp, CRNA Authorized by: Baltazar Bonier, MD   Preanesthetic Checklist Completed: patient identified, IV checked, site marked, risks and benefits discussed, surgical consent, monitors and equipment checked, pre-op evaluation and timeout performed Spinal Block Patient position: sitting Prep: ChloraPrep Patient monitoring: heart rate, continuous pulse ox, blood pressure and cardiac monitor Approach: midline Location: L3-4 Injection technique: single-shot Needle Needle type: Whitacre and Introducer  Needle gauge: 24 G Needle length: 9 cm Assessment Sensory level: T10 Events: CSF return Additional Notes Sterile aseptic technique used throughout the procedure.  Negative paresthesia. Negative blood return. Positive free-flowing CSF. Expiration date of kit checked and confirmed. Patient tolerated procedure well, without complications.

## 2024-02-01 NOTE — Anesthesia Procedure Notes (Signed)
 Date/Time: 02/01/2024 7:44 AM  Performed by: Angelia Kelp, CRNAPre-anesthesia Checklist: Patient identified, Emergency Drugs available, Suction available, Patient being monitored and Timeout performed Patient Re-evaluated:Patient Re-evaluated prior to induction Oxygen Delivery Method: Simple face mask Preoxygenation: Pre-oxygenation with 100% oxygen Induction Type: IV induction

## 2024-02-01 NOTE — Discharge Instructions (Addendum)
 [facility] They will call you to set up when they are coming out to see you   [address], [place], [place] [address] Hours:  Open ? Closes 5?PM Phone: [phone]  Instructions after Anterior Total Hip Replacement        Dr. Zachary Aberman, Jr., M.D.      Dept. of Orthopaedics & Sports Medicine  [facility]  [address]  [place], [place]  [address]  Phone: [phone]   Fax: [phone]    DIET: Drink plenty of non-alcoholic fluids. Resume your normal diet. Include foods high in fiber.  ACTIVITY:  You may use crutches or a walker with weight-bearing as tolerated, unless instructed otherwise. You may be weaned off of the walker or crutches by your Physical Therapist.  Continue doing gentle exercises. Exercising will reduce the pain and swelling, increase motion, and prevent muscle weakness.   Please continue to use the TED compression stockings for 2 weeks. You may remove the stockings at night, but should reapply them in the morning. Do not drive or operate any equipment until instructed.  WOUND CARE:  Continue to use ice packs periodically to reduce pain and swelling. You may shower with honeycomb dressing 3 days after your surgery. Do not submerge incision site under water. Remove honeycomb dressing 7 days after surgery and allow dermabond to fall off on its own.   MEDICATIONS: You may resume your regular medications. Please take the pain medication as prescribed on the medication list. Do not take pain medication on an empty stomach. You have been given a prescription for a blood thinner to prevent blood clots. Please take the medication as instructed. (NOTE: After completing a 2 week course of Lovenox, take one Enteric-coated 81 mg aspirin twice a day for 3 additional weeks.) Pain medications and iron supplements can cause constipation. Use a stool softener (Senokot or Colace) on a daily basis and a laxative (dulcolax or miralax ) as needed. Do not  drive or drink alcoholic beverages when taking pain medications.  POSTOPERATIVE CONSTIPATION PROTOCOL Constipation - defined medically as fewer than three stools per week and severe constipation as less than one stool per week.  One of the most common issues patients have following surgery is constipation.  Even if you have a regular bowel pattern at home, your normal regimen is likely to be disrupted due to multiple reasons following surgery.  Combination of anesthesia, postoperative narcotics, change in appetite and fluid intake all can affect your bowels.  In order to avoid complications following surgery, here are some recommendations in order to help you during your recovery period.  Colace (docusate) - Pick up an over-the-counter form of Colace or another stool softener and take twice a day as long as you are requiring postoperative pain medications.  Take with a full glass of water daily.  If you experience loose stools or diarrhea, hold the colace until you stool forms back up.  If your symptoms do not get better within 1 week or if they get worse, check with your doctor.  Dulcolax (bisacodyl) - Pick up over-the-counter and take as directed by the product packaging as needed to assist with the movement of your bowels.  Take with a full glass of water.  Use this product as needed if not relieved by Colace only.   MiraLax  (polyethylene glycol) - Pick up over-the-counter to have on hand.  MiraLax  is a solution that will increase the amount of water in your bowels to assist with bowel movements.  Take as directed and can mix with a glass of water, juice, soda, coffee, or tea.  Take if you go more than two days without a movement. Do not use MiraLax  more than once per day. Call your doctor if you are still constipated or irregular after using this medication for 7 days in a row.  If you continue to have problems with postoperative constipation, please contact the office for further assistance and  recommendations.  If you experience "the worst abdominal pain ever" or develop nausea or vomiting, please contact the office immediatly for further recommendations for treatment.   CALL THE OFFICE FOR: Temperature above 101 degrees Excessive bleeding or drainage on the dressing. Excessive swelling, coldness, or paleness of the toes. Persistent nausea and vomiting.  FOLLOW-UP:  You should have an appointment to return to the office in 2 weeks after surgery. Arrangements have been made for continuation of Physical Therapy (either home therapy or outpatient therapy).   02/16/2024 9:45 AM EDT [facility]  [address]  [place], [place] [address]  [phone]  Jeppie Moles, [place]  [address] [place], [place] [address]  [phone] (Work)  [phone] (Fax)  PO - Right direct anterior THA - 02/01/24 - Clyda Dark

## 2024-02-01 NOTE — Transfer of Care (Signed)
 Immediate Anesthesia Transfer of Care Note  Patient: Martin Church  Procedure(s) Performed: ARTHROPLASTY, HIP, TOTAL, ANTERIOR APPROACH (Right: Hip)  Patient Location: PACU  Anesthesia Type:Spinal  Level of Consciousness: sedated  Airway & Oxygen Therapy: Patient Spontanous Breathing and Patient connected to face mask oxygen  Post-op Assessment: Report given to RN and Post -op Vital signs reviewed and stable  Post vital signs: Reviewed and stable  Last Vitals:  Vitals Value Taken Time  BP 96/61 02/01/24 1001  Temp 37 C 02/01/24 1001  Pulse 98 02/01/24 1003  Resp 17 02/01/24 1003  SpO2 99 % 02/01/24 1003  Vitals shown include unfiled device data.  Last Pain:  Vitals:   02/01/24 0609  TempSrc: Temporal  PainSc: 3          Complications: No notable events documented.

## 2024-02-02 ENCOUNTER — Encounter: Payer: Self-pay | Admitting: Orthopedic Surgery

## 2024-02-02 ENCOUNTER — Other Ambulatory Visit: Payer: Self-pay

## 2024-02-02 DIAGNOSIS — M1611 Unilateral primary osteoarthritis, right hip: Secondary | ICD-10-CM | POA: Diagnosis not present

## 2024-02-02 DIAGNOSIS — I129 Hypertensive chronic kidney disease with stage 1 through stage 4 chronic kidney disease, or unspecified chronic kidney disease: Secondary | ICD-10-CM | POA: Diagnosis not present

## 2024-02-02 DIAGNOSIS — N183 Chronic kidney disease, stage 3 unspecified: Secondary | ICD-10-CM | POA: Diagnosis not present

## 2024-02-02 DIAGNOSIS — E1122 Type 2 diabetes mellitus with diabetic chronic kidney disease: Secondary | ICD-10-CM | POA: Diagnosis not present

## 2024-02-02 LAB — BASIC METABOLIC PANEL WITH GFR
Anion gap: 8 (ref 5–15)
BUN: 20 mg/dL (ref 8–23)
CO2: 25 mmol/L (ref 22–32)
Calcium: 8.2 mg/dL — ABNORMAL LOW (ref 8.9–10.3)
Chloride: 105 mmol/L (ref 98–111)
Creatinine, Ser: 1.34 mg/dL — ABNORMAL HIGH (ref 0.61–1.24)
GFR, Estimated: 58 mL/min — ABNORMAL LOW (ref >60)
Glucose, Bld: 121 mg/dL — ABNORMAL HIGH (ref 70–99)
Potassium: 4 mmol/L (ref 3.5–5.1)
Sodium: 138 mmol/L (ref 135–145)

## 2024-02-02 LAB — CBC
HCT: 40.8 % (ref 39.0–52.0)
Hemoglobin: 13.7 g/dL (ref 13.0–17.0)
MCH: 27.6 pg (ref 26.0–34.0)
MCHC: 33.6 g/dL (ref 30.0–36.0)
MCV: 82.3 fL (ref 80.0–100.0)
Platelets: 131 10*3/uL — ABNORMAL LOW (ref 150–400)
RBC: 4.96 MIL/uL (ref 4.22–5.81)
RDW: 12.9 % (ref 11.5–15.5)
WBC: 7.4 10*3/uL (ref 4.0–10.5)
nRBC: 0 % (ref 0.0–0.2)

## 2024-02-02 MED ORDER — ENOXAPARIN SODIUM 40 MG/0.4ML IJ SOSY
40.0000 mg | PREFILLED_SYRINGE | INTRAMUSCULAR | 0 refills | Status: AC
  Filled 2024-02-02: qty 5.6, 14d supply, fill #0

## 2024-02-02 MED ORDER — ONDANSETRON HCL 4 MG PO TABS
4.0000 mg | ORAL_TABLET | Freq: Four times a day (QID) | ORAL | 0 refills | Status: DC | PRN
  Filled 2024-02-02: qty 20, 5d supply, fill #0

## 2024-02-02 MED ORDER — TRAMADOL HCL 50 MG PO TABS
50.0000 mg | ORAL_TABLET | Freq: Four times a day (QID) | ORAL | 0 refills | Status: DC | PRN
  Filled 2024-02-02: qty 30, 8d supply, fill #0

## 2024-02-02 MED ORDER — AMLODIPINE BESYLATE 5 MG PO TABS
ORAL_TABLET | ORAL | Status: AC
  Filled 2024-02-02: qty 1

## 2024-02-02 MED ORDER — ACETAMINOPHEN 500 MG PO TABS
1000.0000 mg | ORAL_TABLET | Freq: Three times a day (TID) | ORAL | 0 refills | Status: AC
  Filled 2024-02-02: qty 30, 5d supply, fill #0

## 2024-02-02 MED ORDER — DOCUSATE SODIUM 100 MG PO CAPS
100.0000 mg | ORAL_CAPSULE | Freq: Two times a day (BID) | ORAL | 0 refills | Status: AC
  Filled 2024-02-02: qty 10, 5d supply, fill #0

## 2024-02-02 MED ORDER — OXYCODONE HCL 5 MG PO TABS
2.5000 mg | ORAL_TABLET | Freq: Three times a day (TID) | ORAL | 0 refills | Status: DC | PRN
  Filled 2024-02-02: qty 20, 7d supply, fill #0

## 2024-02-02 NOTE — Progress Notes (Signed)
 Discharge Summary for Martin Church  Discharge Plan: Patient will be discharged home as per the MD's order. We discussed prescriptions and follow-up appointments with the patient. The prescriptions were provided, and the medication list was explained in detail. Patient confirmed understanding of the instructions.  Skin Assessment: The patient's skin is clean, dry, and intact, with no signs of breakdown or tears. The IV catheter was removed, and the skin remains intact. The site shows no signs or symptoms of complications. A dressing and pressure were applied to the site. Patient reports no pain and has no complaints.  After-Visit Summary: An After-Visit Summary was printed and given to the patient. The following items were sent home with patient:  Alcohol pads Medications Two TED hose placed on both patient legs  The patient was escorted via wheelchair and discharged home in a private vehicle.  Landy Dunnavant D. Donnette Gal, RN

## 2024-02-02 NOTE — TOC Transition Note (Signed)
 Transition of Care [facility]) - Discharge Note   Patient Details  Name: Martin Church MRN: [id number] Date of Birth: 30-Dec-1955  Transition of Care [facility]) CM/SW Contact:  Martin Ice, RN Phone Number: 02/02/2024, 8:16 AM   Clinical Narrative:    Patient lives with spouse, Martin Church. Home DME: cane, grab-bars, RW, BSC. He has PCP established. Patient set up with [facility] by surgeon's office prior to surgery. Contact information added to AVS.     Barriers to Discharge: Barriers Resolved   Patient Goals and CMS Choice        Expected Discharge Plan and Services           Expected Discharge Date: 02/02/24                 DME Agency: NA       HH Arranged: PT, OT HH Agency: [facility] Date HH Agency Contacted: 02/02/24 Time HH Agency Contacted: [phone] Representative spoke with at [facility] Agency: Shaun  Prior Living Arrangements/Services                       Activities of Daily Living   ADL Screening (condition at time of admission) Independently performs ADLs?: Yes (appropriate for developmental age) Is the patient deaf or have difficulty hearing?: No Does the patient have difficulty seeing, even when wearing glasses/contacts?: No Does the patient have difficulty concentrating, remembering, or making decisions?: No  Permission Sought/Granted                  Emotional Assessment              Admission diagnosis:  Primary osteoarthritis of right hip [M16.11] S/P total right hip arthroplasty [W10.272] Patient Active Problem List   Diagnosis Date Noted   S/P total right hip arthroplasty 02/01/2024   PCP:  Martin Moulding, MD Pharmacy:   [facility] [address] [place] [address] Phone: [phone] Fax: [phone]     Social Determinants of Health (SDOH) Interventions    Readmission Risk Interventions     No data to display            Final next level of care: Home w Home Health Services Barriers to Discharge: Barriers Resolved   Patient Goals and CMS Choice            Discharge Placement                  Name of family member notified: Martin Church Patient and family notified of of transfer: 02/02/24  Discharge Plan and Services Additional resources added to the After Visit Summary for                    DME Agency: NA       HH Arranged: PT, OT HH Agency: [facility] Date HH Agency Contacted: 02/02/24 Time HH Agency Contacted: [phone] Representative spoke with at [facility] Agency: Shaun  Social Drivers of Health (SDOH) Interventions SDOH Screenings   Food Insecurity: No Food Insecurity (02/01/2024)  Housing: Low Risk  (02/01/2024)  Transportation Needs: No Transportation Needs (02/01/2024)  Utilities: Not At Risk (02/01/2024)  Financial Resource Strain: Low Risk  (01/13/2024)   Received from [facility]  Social Connections: Socially Integrated (02/01/2024)  Tobacco Use: Low Risk  (02/01/2024)     Readmission Risk Interventions     No data to display

## 2024-02-02 NOTE — Progress Notes (Signed)
 Physical Therapy Treatment Patient Details Name: Martin Church MRN: [id number] DOB: Aug 21, 1956 Today's Date: 02/02/2024   History of Present Illness Pt is a 68 yo male s/p R THA. PMH of HTN, DM, CKD, HLD.    PT Comments  Pt was long sitting in bed upon arrival. He is A and O x 4. Agreeable and pleasant throughout. BP checked throughout session and remained stable. No symptoms of hypotension throughout session." I feel a lot better than yesterday." Pt demonstrated safe abilities to exit bed, stand to RW, and tolerate ambulation ~ 200 ft. Pt has one step to enter home and demonstrated abilities to safely perform. Overall, pt is progressing well and cleared from an acute PT standpoint for safe DC home.    If plan is discharge home, recommend the following: A little help with walking and/or transfers;A little help with bathing/dressing/bathroom;Assistance with cooking/housework;Assist for transportation;Help with stairs or ramp for entrance     Equipment Recommendations  None recommended by PT       Precautions / Restrictions Precautions Precautions: Fall;Anterior Hip Precaution Booklet Issued: Yes (comment) Recall of Precautions/Restrictions: Intact Restrictions Weight Bearing Restrictions Per Provider Order: Yes RLE Weight Bearing Per Provider Order: Weight bearing as tolerated     Mobility  Bed Mobility Overal bed mobility: Needs Assistance Bed Mobility: Supine to Sit  Supine to sit: Supervision  General bed mobility comments: Pt was able to exit bed with assistance with sheet    Transfers Overall transfer level: Needs assistance Equipment used: Rolling walker (2 wheels) Transfers: Sit to/from Stand Sit to Stand: Supervision  General transfer comment: Pt was able to stand from slightly elevated bed height without physical assistance    Ambulation/Gait Ambulation/Gait assistance: Supervision Gait Distance (Feet): 200 Feet Assistive device: Rolling walker (2 wheels) Gait  Pattern/deviations: Step-to pattern, Step-through pattern Gait velocity: decreased  General Gait Details: Pt was able to ambulate 200 ft with RW without LOB or safety concerns.   Stairs Stairs: Yes  General stair comments: pt was educated on stair performance to simulate 1 step entry. Both pt and caregiver state confidence in abilities to safely enter/exit home.     Balance Overall balance assessment: Needs assistance Sitting-balance support: Feet supported Sitting balance-Leahy Scale: Good     Standing balance support: Bilateral upper extremity supported, During functional activity, Reliant on assistive device for balance Standing balance-Leahy Scale: Good       Communication Communication Communication: No apparent difficulties  Cognition Arousal: Alert Behavior During Therapy: WFL for tasks assessed/performed   PT - Cognitive impairments: No apparent impairments      PT - Cognition Comments: Pt is A and O x 4 Following commands: Intact      Cueing Cueing Techniques: Verbal cues, Tactile cues     General Comments General comments (skin integrity, edema, etc.): reviewed importance of HEP and routine mobility      Pertinent Vitals/Pain Pain Assessment Pain Assessment: 0-10 Pain Score: 7  Pain Location: R hip Pain Descriptors / Indicators: Aching, Sore Pain Intervention(s): Limited activity within patient's tolerance, Monitored during session, Premedicated before session, Repositioned, Ice applied     PT Goals (current goals can now be found in the care plan section) Acute Rehab PT Goals Patient Stated Goal: To go home Progress towards PT goals: Progressing toward goals    Frequency    BID       AM-PAC PT "6 Clicks" Mobility   Outcome Measure  Help needed turning from your back to your side while  in a flat bed without using bedrails?: A Little Help needed moving from lying on your back to sitting on the side of a flat bed without using bedrails?: A  Little Help needed moving to and from a bed to a chair (including a wheelchair)?: A Little Help needed standing up from a chair using your arms (e.g., wheelchair or bedside chair)?: A Little Help needed to walk in hospital room?: A Little Help needed climbing 3-5 steps with a railing? : A Little 6 Click Score: 18    End of Session   Activity Tolerance: Patient tolerated treatment well Patient left: in chair;with call bell/phone within reach;with family/visitor present Nurse Communication: Mobility status PT Visit Diagnosis: Other abnormalities of gait and mobility (R26.89);Difficulty in walking, not elsewhere classified (R26.2);Muscle weakness (generalized) (M62.81);Pain Pain - Right/Left: Right Pain - part of body: Hip     Time: 4098-1191 PT Time Calculation (min) (ACUTE ONLY): 24 min  Charges:    $Gait Training: 8-22 mins $Therapeutic Activity: 8-22 mins PT General Charges $$ ACUTE PT VISIT: 1 Visit                     Chester Costa PTA 02/02/24, 8:50 AM

## 2024-02-02 NOTE — Anesthesia Postprocedure Evaluation (Signed)
 Anesthesia Post Note  Patient: Martin Church  Procedure(s) Performed: ARTHROPLASTY, HIP, TOTAL, ANTERIOR APPROACH (Right: Hip)  Patient location during evaluation: Nursing Unit Anesthesia Type: Spinal Level of consciousness: oriented and awake and alert Pain management: pain level controlled Vital Signs Assessment: post-procedure vital signs reviewed and stable Respiratory status: spontaneous breathing and respiratory function stable Cardiovascular status: blood pressure returned to baseline and stable Postop Assessment: no headache, no backache, no apparent nausea or vomiting and patient able to bend at knees Anesthetic complications: no   No notable events documented.   Last Vitals:  Vitals:   02/02/24 0358 02/02/24 0630  BP: 119/83 119/83  Pulse: 91   Resp: 18   Temp: 37 C   SpO2: 96%     Last Pain:  Vitals:   02/02/24 0602  TempSrc:   PainSc: 4                  Damontre Millea Daril Edge

## 2024-02-02 NOTE — Discharge Summary (Signed)
 Physician Discharge Summary  Patient ID: Martin Church MRN: [id number] DOB/AGE: Sep 05, 1955 68 y.o.  Admit date: 02/01/2024 Discharge date: 02/02/2024  Admission Diagnoses:  Primary osteoarthritis of right hip [M16.11] S/P total right hip arthroplasty [Z96.641]   Discharge Diagnoses: Patient Active Problem List   Diagnosis Date Noted   S/P total right hip arthroplasty 02/01/2024    Past Medical History:  Diagnosis Date   CKD (chronic kidney disease) stage 3, GFR 30-59 ml/min (HCC)    Diet-controlled type 2 diabetes mellitus (HCC)    ED (erectile dysfunction)    HLD (hyperlipidemia)    Hypertension    Osteoarthritis of right hip    Tubular adenoma of colon    Vitamin D deficiency      Transfusion: none   Consultants (if any):   Discharged Condition: Improved  Hospital Course: Martin Church is an 68 y.o. male who was admitted 02/01/2024 with a diagnosis of S/P total right hip arthroplasty and went to the operating room on 02/01/2024 and underwent the above named procedures.    Surgeries: Procedure(s): ARTHROPLASTY, HIP, TOTAL, ANTERIOR APPROACH on 02/01/2024 Patient tolerated the surgery well. Taken to PACU where she was stabilized and then transferred to the orthopedic floor.  Started on Lovenox 40 mg q 24 hrs. TEDs and SCDs applied bilaterally. Heels elevated on bed. No evidence of DVT. Negative Homan. Physical therapy started on day #1 for gait training and transfer. OT started day #1 for ADL and assisted devices.  Patient's IV was d/c on day #1. Patient was able to safely and independently complete all PT goals. PT recommending discharge to home.    On post op day #1 patient was stable and ready for discharge to home with HHPT.  Implants: Cup: Trident Tritanium Clusterhole 56/F w/ x2 screws    Liner: Neutral X3 poly 40/F  Stem: Insignia #7 std offset  Head:Biolox ceramic 40mm with -2.62mm Taper adapter sleeve    He was given perioperative antibiotics:  Anti-infectives  (From admission, onward)    Start     Dose/Rate Route Frequency Ordered Stop   02/01/24 1400  ceFAZolin (ANCEF) IVPB 2g/100 mL premix        2 g 200 mL/hr over 30 Minutes Intravenous Every 6 hours 02/01/24 1048 02/01/24 2015   02/01/24 0615  ceFAZolin (ANCEF) IVPB 2g/100 mL premix        2 g 200 mL/hr over 30 Minutes Intravenous On call to O.R. 02/01/24 6578 02/01/24 0754     .  He was given sequential compression devices, early ambulation, and Lovenox  for DVT prophylaxis.  He benefited maximally from the hospital stay and there were no complications.    Recent vital signs:  Vitals:   02/02/24 0358 02/02/24 0630  BP: 119/83 119/83  Pulse: 91   Resp: 18   Temp: 98.6 F (37 C)   SpO2: 96%     Recent laboratory studies:  Lab Results  Component Value Date   HGB 13.7 02/02/2024   HGB 16.6 01/21/2024   HGB 15.4 01/18/2024   Lab Results  Component Value Date   WBC 7.4 02/02/2024   PLT 131 (L) 02/02/2024   No results found for: "INR" Lab Results  Component Value Date   NA 138 02/02/2024   K 4.0 02/02/2024   CL 105 02/02/2024   CO2 25 02/02/2024   BUN 20 02/02/2024   CREATININE 1.34 (H) 02/02/2024   GLUCOSE 121 (H) 02/02/2024    Discharge Medications:   Allergies as  of 02/02/2024   No Known Allergies      Medication List     STOP taking these medications    meloxicam  15 MG tablet Commonly known as: MOBIC        TAKE these medications    acetaminophen  500 MG tablet Commonly known as: TYLENOL  Take 2 tablets (1,000 mg total) by mouth every 8 (eight) hours.   amLODipine  10 MG tablet Commonly known as: NORVASC  Take 10 mg by mouth every morning.   cyanocobalamin 1000 MCG tablet Commonly known as: VITAMIN B12 Take 1,000 mcg by mouth daily.   docusate sodium 100 MG capsule Commonly known as: COLACE Take 1 capsule (100 mg total) by mouth 2 (two) times daily.   enoxaparin 40 MG/0.4ML injection Commonly known as: LOVENOX Inject 0.4 mLs (40 mg total)  into the skin daily for 14 days.   ferrous sulfate 324 MG Tbec Take 324 mg by mouth daily with breakfast.   multivitamin tablet Take 1 tablet by mouth daily.   ondansetron 4 MG tablet Commonly known as: ZOFRAN Take 1 tablet (4 mg total) by mouth every 6 (six) hours as needed for nausea.   oxyCODONE 5 MG immediate release tablet Commonly known as: Roxicodone Take 0.5-1 tablets (2.5-5 mg total) by mouth every 8 (eight) hours as needed for breakthrough pain.   rosuvastatin  20 MG tablet Commonly known as: CRESTOR  Take 20 mg by mouth at bedtime.   sildenafil  100 MG tablet Commonly known as: VIAGRA  Take 100 mg by mouth as needed for erectile dysfunction.   traMADol 50 MG tablet Commonly known as: ULTRAM Take 1 tablet (50 mg total) by mouth every 6 (six) hours as needed for moderate pain (pain score 4-6).   vitamin E 180 MG (400 UNITS) capsule Take 400 Units by mouth daily.        Diagnostic Studies: DG HIP UNILAT WITH PELVIS 2-3 VIEWS RIGHT Result Date: 02/01/2024 CLINICAL DATA:  Elective surgery. EXAM: DG HIP (WITH OR WITHOUT PELVIS) 2-3V RIGHT COMPARISON:  None Available. FINDINGS: Images were performed intraoperatively without the presence of a radiologist. Total right hip arthroplasty. No hardware complication is seen. Total fluoroscopy images: 3 Total fluoroscopy time: 17 seconds Total dose: Radiation Exposure Index (as provided by the fluoroscopic device): 2.5 mGy air Kerma Please see intraoperative findings for further detail. IMPRESSION: Intraoperative fluoroscopy for total right hip arthroplasty. Electronically Signed   By: Bertina Broccoli M.D.   On: 02/01/2024 12:55   DG C-Arm 1-60 Min-No Report Result Date: 02/01/2024 Fluoroscopy was utilized by the requesting physician.  No radiographic interpretation.   DG C-Arm 1-60 Min-No Report Result Date: 02/01/2024 Fluoroscopy was utilized by the requesting physician.  No radiographic interpretation.    Disposition:       Follow-up Information     [facility]  Follow up.   Contact information: [address] [place] [address] [phone]         Coralyn Derry, PA-C Follow up in 2 week(s).   Specialties: Orthopedic Surgery, Emergency Medicine Contact information: [address] [place] [address] [phone]                  Signed: Coralyn Derry 02/02/2024, 8:08 AM

## 2024-02-02 NOTE — Progress Notes (Signed)
   Subjective: 1 Day Post-Op Procedure(s) (LRB): ARTHROPLASTY, HIP, TOTAL, ANTERIOR APPROACH (Right) Patient reports pain as mild.   Patient is well, and has had no acute complaints or problems Denies any CP, SOB, ABD pain. We will continue therapy today.  Plan is to go Home after hospital stay.  Objective: Vital signs in last 24 hours: Temp:  [97.6 F (36.4 C)-99.7 F (37.6 C)] 98.6 F (37 C) (06/03 0358) Pulse Rate:  [71-102] 91 (06/03 0358) Resp:  [11-20] 18 (06/03 0358) BP: (92-127)/(60-86) 119/83 (06/03 0630) SpO2:  [94 %-99 %] 96 % (06/03 0358)  Intake/Output from previous day: 06/02 0701 - 06/03 0700 In: 2106.3 [I.V.:1807.1; IV Piggyback:299.2] Out: 800 [Urine:650; Blood:150] Intake/Output this shift: No intake/output data recorded.  Recent Labs    02/02/24 0535  HGB 13.7   Recent Labs    02/02/24 0535  WBC 7.4  RBC 4.96  HCT 40.8  PLT 131*   Recent Labs    02/02/24 0535  NA 138  K 4.0  CL 105  CO2 25  BUN 20  CREATININE 1.34*  GLUCOSE 121*  CALCIUM  8.2*   No results for input(s): "LABPT", "INR" in the last 72 hours.  EXAM General - Patient is Alert, Appropriate, and Oriented Extremity - Neurovascular intact Sensation intact distally Intact pulses distally Dorsiflexion/Plantar flexion intact No cellulitis present Compartment soft Dressing - dressing C/D/I and no drainage Motor Function - intact, moving foot and toes well on exam.   Past Medical History:  Diagnosis Date   CKD (chronic kidney disease) stage 3, GFR 30-59 ml/min (HCC)    Diet-controlled type 2 diabetes mellitus (HCC)    ED (erectile dysfunction)    HLD (hyperlipidemia)    Hypertension    Osteoarthritis of right hip    Tubular adenoma of colon    Vitamin D deficiency     Assessment/Plan:   1 Day Post-Op Procedure(s) (LRB): ARTHROPLASTY, HIP, TOTAL, ANTERIOR APPROACH (Right) Principal Problem:   S/P total right hip arthroplasty  Estimated body mass index is 30.61  kg/m as calculated from the following:   Height as of this encounter: 6\' 2"  (1.88 m).   Weight as of this encounter: 108.1 kg. Advance diet Up with therapy Pain well controlled Labs and VSS CM to assist with discharge to home with HHPT today  DVT Prophylaxis - Lovenox, TED hose, and SCDS Weight-Bearing as tolerated to right leg   T. Thomos Flies, PA-C [facility] 02/02/2024, 8:01 AM

## 2024-02-02 NOTE — Plan of Care (Signed)
  Problem: Education: Goal: Knowledge of General Education information will improve Description: Including pain rating scale, medication(s)/side effects and non-pharmacologic comfort measures Outcome: Progressing   Problem: Health Behavior/Discharge Planning: Goal: Ability to manage health-related needs will improve Outcome: Progressing   Problem: Clinical Measurements: Goal: Ability to maintain clinical measurements within normal limits will improve Outcome: Progressing   Problem: Activity: Goal: Risk for activity intolerance will decrease Outcome: Progressing   Problem: Nutrition: Goal: Adequate nutrition will be maintained Outcome: Progressing   Problem: Pain Managment: Goal: General experience of comfort will improve and/or be controlled Outcome: Progressing   Problem: Skin Integrity: Goal: Risk for impaired skin integrity will decrease Outcome: Progressing

## 2024-02-03 ENCOUNTER — Other Ambulatory Visit: Payer: Self-pay

## 2024-02-04 DIAGNOSIS — E1122 Type 2 diabetes mellitus with diabetic chronic kidney disease: Secondary | ICD-10-CM | POA: Diagnosis not present

## 2024-02-04 DIAGNOSIS — Z96641 Presence of right artificial hip joint: Secondary | ICD-10-CM | POA: Diagnosis not present

## 2024-02-04 DIAGNOSIS — E785 Hyperlipidemia, unspecified: Secondary | ICD-10-CM | POA: Diagnosis not present

## 2024-02-04 DIAGNOSIS — Z471 Aftercare following joint replacement surgery: Secondary | ICD-10-CM | POA: Diagnosis not present

## 2024-02-04 DIAGNOSIS — N183 Chronic kidney disease, stage 3 unspecified: Secondary | ICD-10-CM | POA: Diagnosis not present

## 2024-02-04 DIAGNOSIS — I129 Hypertensive chronic kidney disease with stage 1 through stage 4 chronic kidney disease, or unspecified chronic kidney disease: Secondary | ICD-10-CM | POA: Diagnosis not present

## 2024-02-04 DIAGNOSIS — E559 Vitamin D deficiency, unspecified: Secondary | ICD-10-CM | POA: Diagnosis not present

## 2024-02-04 DIAGNOSIS — Z860101 Personal history of adenomatous and serrated colon polyps: Secondary | ICD-10-CM | POA: Diagnosis not present

## 2024-02-09 DIAGNOSIS — E785 Hyperlipidemia, unspecified: Secondary | ICD-10-CM | POA: Diagnosis not present

## 2024-02-09 DIAGNOSIS — E1122 Type 2 diabetes mellitus with diabetic chronic kidney disease: Secondary | ICD-10-CM | POA: Diagnosis not present

## 2024-02-09 DIAGNOSIS — Z860101 Personal history of adenomatous and serrated colon polyps: Secondary | ICD-10-CM | POA: Diagnosis not present

## 2024-02-09 DIAGNOSIS — N183 Chronic kidney disease, stage 3 unspecified: Secondary | ICD-10-CM | POA: Diagnosis not present

## 2024-02-09 DIAGNOSIS — E559 Vitamin D deficiency, unspecified: Secondary | ICD-10-CM | POA: Diagnosis not present

## 2024-02-09 DIAGNOSIS — I129 Hypertensive chronic kidney disease with stage 1 through stage 4 chronic kidney disease, or unspecified chronic kidney disease: Secondary | ICD-10-CM | POA: Diagnosis not present

## 2024-02-09 DIAGNOSIS — Z96641 Presence of right artificial hip joint: Secondary | ICD-10-CM | POA: Diagnosis not present

## 2024-02-09 DIAGNOSIS — Z471 Aftercare following joint replacement surgery: Secondary | ICD-10-CM | POA: Diagnosis not present

## 2024-02-12 DIAGNOSIS — Z96641 Presence of right artificial hip joint: Secondary | ICD-10-CM | POA: Diagnosis not present

## 2024-02-12 DIAGNOSIS — E1122 Type 2 diabetes mellitus with diabetic chronic kidney disease: Secondary | ICD-10-CM | POA: Diagnosis not present

## 2024-02-12 DIAGNOSIS — E785 Hyperlipidemia, unspecified: Secondary | ICD-10-CM | POA: Diagnosis not present

## 2024-02-12 DIAGNOSIS — N183 Chronic kidney disease, stage 3 unspecified: Secondary | ICD-10-CM | POA: Diagnosis not present

## 2024-02-12 DIAGNOSIS — Z860101 Personal history of adenomatous and serrated colon polyps: Secondary | ICD-10-CM | POA: Diagnosis not present

## 2024-02-12 DIAGNOSIS — I129 Hypertensive chronic kidney disease with stage 1 through stage 4 chronic kidney disease, or unspecified chronic kidney disease: Secondary | ICD-10-CM | POA: Diagnosis not present

## 2024-02-12 DIAGNOSIS — E559 Vitamin D deficiency, unspecified: Secondary | ICD-10-CM | POA: Diagnosis not present

## 2024-02-12 DIAGNOSIS — Z471 Aftercare following joint replacement surgery: Secondary | ICD-10-CM | POA: Diagnosis not present

## 2024-02-15 DIAGNOSIS — Z860101 Personal history of adenomatous and serrated colon polyps: Secondary | ICD-10-CM | POA: Diagnosis not present

## 2024-02-15 DIAGNOSIS — I129 Hypertensive chronic kidney disease with stage 1 through stage 4 chronic kidney disease, or unspecified chronic kidney disease: Secondary | ICD-10-CM | POA: Diagnosis not present

## 2024-02-15 DIAGNOSIS — N183 Chronic kidney disease, stage 3 unspecified: Secondary | ICD-10-CM | POA: Diagnosis not present

## 2024-02-15 DIAGNOSIS — E785 Hyperlipidemia, unspecified: Secondary | ICD-10-CM | POA: Diagnosis not present

## 2024-02-15 DIAGNOSIS — Z471 Aftercare following joint replacement surgery: Secondary | ICD-10-CM | POA: Diagnosis not present

## 2024-02-15 DIAGNOSIS — E559 Vitamin D deficiency, unspecified: Secondary | ICD-10-CM | POA: Diagnosis not present

## 2024-02-15 DIAGNOSIS — Z96641 Presence of right artificial hip joint: Secondary | ICD-10-CM | POA: Diagnosis not present

## 2024-02-15 DIAGNOSIS — E1122 Type 2 diabetes mellitus with diabetic chronic kidney disease: Secondary | ICD-10-CM | POA: Diagnosis not present

## 2024-02-16 ENCOUNTER — Other Ambulatory Visit: Payer: Self-pay

## 2024-02-16 MED ORDER — OXYCODONE HCL 5 MG PO TABS
5.0000 mg | ORAL_TABLET | Freq: Four times a day (QID) | ORAL | 0 refills | Status: DC | PRN
  Filled 2024-02-16: qty 20, 5d supply, fill #0

## 2024-02-16 MED ORDER — TRAMADOL HCL 50 MG PO TABS
50.0000 mg | ORAL_TABLET | Freq: Four times a day (QID) | ORAL | 0 refills | Status: DC | PRN
  Filled 2024-02-16: qty 20, 5d supply, fill #0

## 2024-02-18 DIAGNOSIS — E785 Hyperlipidemia, unspecified: Secondary | ICD-10-CM | POA: Diagnosis not present

## 2024-02-18 DIAGNOSIS — I129 Hypertensive chronic kidney disease with stage 1 through stage 4 chronic kidney disease, or unspecified chronic kidney disease: Secondary | ICD-10-CM | POA: Diagnosis not present

## 2024-02-18 DIAGNOSIS — Z860101 Personal history of adenomatous and serrated colon polyps: Secondary | ICD-10-CM | POA: Diagnosis not present

## 2024-02-18 DIAGNOSIS — Z471 Aftercare following joint replacement surgery: Secondary | ICD-10-CM | POA: Diagnosis not present

## 2024-02-18 DIAGNOSIS — E1122 Type 2 diabetes mellitus with diabetic chronic kidney disease: Secondary | ICD-10-CM | POA: Diagnosis not present

## 2024-02-18 DIAGNOSIS — E559 Vitamin D deficiency, unspecified: Secondary | ICD-10-CM | POA: Diagnosis not present

## 2024-02-18 DIAGNOSIS — Z96641 Presence of right artificial hip joint: Secondary | ICD-10-CM | POA: Diagnosis not present

## 2024-02-18 DIAGNOSIS — N183 Chronic kidney disease, stage 3 unspecified: Secondary | ICD-10-CM | POA: Diagnosis not present

## 2024-02-22 DIAGNOSIS — E785 Hyperlipidemia, unspecified: Secondary | ICD-10-CM | POA: Diagnosis not present

## 2024-02-22 DIAGNOSIS — E1122 Type 2 diabetes mellitus with diabetic chronic kidney disease: Secondary | ICD-10-CM | POA: Diagnosis not present

## 2024-02-22 DIAGNOSIS — Z96641 Presence of right artificial hip joint: Secondary | ICD-10-CM | POA: Diagnosis not present

## 2024-02-22 DIAGNOSIS — I129 Hypertensive chronic kidney disease with stage 1 through stage 4 chronic kidney disease, or unspecified chronic kidney disease: Secondary | ICD-10-CM | POA: Diagnosis not present

## 2024-02-22 DIAGNOSIS — Z860101 Personal history of adenomatous and serrated colon polyps: Secondary | ICD-10-CM | POA: Diagnosis not present

## 2024-02-22 DIAGNOSIS — E559 Vitamin D deficiency, unspecified: Secondary | ICD-10-CM | POA: Diagnosis not present

## 2024-02-22 DIAGNOSIS — N183 Chronic kidney disease, stage 3 unspecified: Secondary | ICD-10-CM | POA: Diagnosis not present

## 2024-02-22 DIAGNOSIS — Z471 Aftercare following joint replacement surgery: Secondary | ICD-10-CM | POA: Diagnosis not present

## 2024-02-25 ENCOUNTER — Telehealth: Payer: Self-pay

## 2024-02-25 NOTE — Telephone Encounter (Signed)
 See separate encounter

## 2024-02-25 NOTE — Telephone Encounter (Addendum)
 Pt called the triage line stating he just got hip surgery, and was told by his doctor to avoid getting injections or cuts on the glutes to prevent infections. Pt states he does not want any needles or cuts on his bottom until he is fully head from his hip surgery, pt states he will fully recovered by February. Pt wanted to know if he could try something else for his low testosterone . Pt states he can feel a different with it being so low. Pt wanted to know if he could try a testosterone  cream. Please advise.

## 2024-02-26 NOTE — Telephone Encounter (Signed)
 Pt's calls triage line and states that he uses the pharmacy on file, [facility].

## 2024-02-26 NOTE — Telephone Encounter (Signed)
 Called patient to find out what pharmacy he would like for the testosterone  to. Patient didn't answer so left a voicemail

## 2024-02-29 ENCOUNTER — Other Ambulatory Visit: Payer: Self-pay

## 2024-03-01 DIAGNOSIS — E785 Hyperlipidemia, unspecified: Secondary | ICD-10-CM | POA: Diagnosis not present

## 2024-03-01 DIAGNOSIS — Z471 Aftercare following joint replacement surgery: Secondary | ICD-10-CM | POA: Diagnosis not present

## 2024-03-01 DIAGNOSIS — E559 Vitamin D deficiency, unspecified: Secondary | ICD-10-CM | POA: Diagnosis not present

## 2024-03-01 DIAGNOSIS — E1122 Type 2 diabetes mellitus with diabetic chronic kidney disease: Secondary | ICD-10-CM | POA: Diagnosis not present

## 2024-03-01 DIAGNOSIS — I129 Hypertensive chronic kidney disease with stage 1 through stage 4 chronic kidney disease, or unspecified chronic kidney disease: Secondary | ICD-10-CM | POA: Diagnosis not present

## 2024-03-01 DIAGNOSIS — Z860101 Personal history of adenomatous and serrated colon polyps: Secondary | ICD-10-CM | POA: Diagnosis not present

## 2024-03-01 DIAGNOSIS — Z96641 Presence of right artificial hip joint: Secondary | ICD-10-CM | POA: Diagnosis not present

## 2024-03-01 DIAGNOSIS — N183 Chronic kidney disease, stage 3 unspecified: Secondary | ICD-10-CM | POA: Diagnosis not present

## 2024-03-02 ENCOUNTER — Other Ambulatory Visit: Payer: Self-pay

## 2024-03-02 NOTE — Telephone Encounter (Signed)
 Pt called for an update on his medication, states it was never sent to his pharmacy.

## 2024-03-03 ENCOUNTER — Other Ambulatory Visit: Payer: Self-pay

## 2024-03-03 ENCOUNTER — Other Ambulatory Visit: Payer: Self-pay | Admitting: Urology

## 2024-03-03 DIAGNOSIS — E291 Testicular hypofunction: Secondary | ICD-10-CM

## 2024-03-03 MED ORDER — TESTOSTERONE 20.25 MG/1.25GM (1.62%) TD GEL
TRANSDERMAL | 1 refills | Status: DC
  Filled 2024-03-03: qty 150, 30d supply, fill #0
  Filled 2024-03-09 (×3): qty 150, 60d supply, fill #0
  Filled 2024-03-10: qty 150, 30d supply, fill #0

## 2024-03-07 ENCOUNTER — Other Ambulatory Visit: Payer: Self-pay

## 2024-03-09 ENCOUNTER — Other Ambulatory Visit: Payer: Self-pay

## 2024-03-09 NOTE — Telephone Encounter (Signed)
 Patient was advise that RX was approved

## 2024-03-09 NOTE — Telephone Encounter (Signed)
 Patient stopped in and stated that he was notified by [facility] that insurance has denied his RX for testosterone  gel. Patient states that he was provided a number for our office to call and dispute the denial at [phone]. Patient is requesting a call back ASAP. Please advise patient.

## 2024-03-09 NOTE — Telephone Encounter (Signed)
 Error

## 2024-03-10 ENCOUNTER — Other Ambulatory Visit: Payer: Self-pay

## 2024-03-10 ENCOUNTER — Other Ambulatory Visit: Payer: Self-pay | Admitting: Urology

## 2024-03-10 DIAGNOSIS — E291 Testicular hypofunction: Secondary | ICD-10-CM

## 2024-03-10 MED ORDER — TESTOSTERONE 20.25 MG/1.25GM (1.62%) TD GEL
TRANSDERMAL | 1 refills | Status: AC
  Filled 2024-03-10 (×2): qty 150, 30d supply, fill #0
  Filled 2024-03-15: qty 37.5, 30d supply, fill #0
  Filled 2024-03-15: qty 150, 30d supply, fill #0
  Filled 2024-03-15: qty 150, 28d supply, fill #0
  Filled 2024-04-21: qty 37.5, 30d supply, fill #1

## 2024-03-10 NOTE — Telephone Encounter (Signed)
 Called patient and let him know that Im still looking in to this and I will get back intouch with him tomorrow

## 2024-03-10 NOTE — Telephone Encounter (Signed)
 Pt called the triage really frustrated because nobody has contacted him regarding his testosterone  medication. Pt states he stopped by the office yesterday and have us  a number for us  to call to dispute PA denial, but still hasn't heard about any updates. I called the pharmacy to get more information. Pharmacy states that the PA they sent was denied and the pt would have to pay $1,600. I looked into the PA denial form they sent, states they approved partially approved. Please call the number the pt provided to see if they dispute the denial.

## 2024-03-14 NOTE — Telephone Encounter (Signed)
 Called patient on Friday and let patient know that I had talked to his insurance and that what a new RX with a higher strength and lower quality

## 2024-03-15 ENCOUNTER — Other Ambulatory Visit: Payer: Self-pay

## 2024-03-15 NOTE — Telephone Encounter (Signed)
 pt LVM on the triage line stating we need to call his insurance dispute denial. pt is very frustrated, states that this back a fourth has been going on for a while. states he would like to get his medication and also would like to know how much he would have to pay out of pocket. I sent a secure chat to [place].

## 2024-03-16 ENCOUNTER — Other Ambulatory Visit: Payer: Self-pay

## 2024-03-16 DIAGNOSIS — Z96641 Presence of right artificial hip joint: Secondary | ICD-10-CM | POA: Diagnosis not present

## 2024-03-17 ENCOUNTER — Telehealth: Payer: Self-pay | Admitting: Urology

## 2024-03-17 ENCOUNTER — Other Ambulatory Visit: Payer: Self-pay

## 2024-03-17 NOTE — Telephone Encounter (Signed)
 There was not anything documented in his orthopedic surgeon's note regarding whether or not it is safe for him to return to Testopel  pellet insertion for treatment for his hypogonadism.  We will need to get clearance from Dr. Lorelle prior to inserting the pellets.

## 2024-03-24 ENCOUNTER — Other Ambulatory Visit: Payer: Self-pay

## 2024-04-12 ENCOUNTER — Other Ambulatory Visit: Payer: Self-pay

## 2024-04-19 NOTE — Progress Notes (Unsigned)
   He presents today for Testopel  insertion.  Identified upper outer quadrant of left hip for insertion; prepped area with Betadine and injected 5 cc's of Lidocaine  1% to anesthetize superficially and distally along trocar tract.  Made 3 mm incision using 11 blade of scalpel; trocar with sharp ended stylet was inserted into subcutaneous tissue in line with femur. Sharp stylet was withdrawn and 6 pellets were placed into trocar well. Testopel  pellets advanced into tissue using blunt ended stylet. Trocar removed and incision closed using 6 Steri-Strips. Cleansed area to remove Batadine and covered Steri-Strips with outer Band-Aid.  Careful inspection of insertion is done and patient informed of post procedure instructions.  Advised patient to apply ice to the site for 20-30 minutes every hour if needed.  Avoid hot tubes, swimming or full water immersion of the insertion site for 72 hours.  Bandage may be removed after one week.    Patient is advised to contact the office if experiencing drainage of the insertion site, excessive redness or swelling of the site, chills and/or fevers > 101.5, nausea or vomiting, dizziness or lightheadedness and excessive tenderness.  Avoid strenuous activity and heavy lifting for 72 hours.     He will return in one month for serum testosterone , hemoglobin and hematocrit.

## 2024-04-20 ENCOUNTER — Ambulatory Visit: Admitting: Urology

## 2024-04-20 VITALS — BP 129/84 | HR 69 | Ht 74.0 in | Wt 228.0 lb

## 2024-04-20 DIAGNOSIS — E291 Testicular hypofunction: Secondary | ICD-10-CM

## 2024-04-20 MED ORDER — TESTOSTERONE 75 MG IL PLLT
450.0000 mg | PELLET | Freq: Once | Status: AC
  Administered 2024-04-20: 450 mg

## 2024-04-21 ENCOUNTER — Other Ambulatory Visit: Payer: Self-pay

## 2024-04-28 DIAGNOSIS — Z125 Encounter for screening for malignant neoplasm of prostate: Secondary | ICD-10-CM | POA: Diagnosis not present

## 2024-04-28 DIAGNOSIS — N183 Chronic kidney disease, stage 3 unspecified: Secondary | ICD-10-CM | POA: Diagnosis not present

## 2024-04-28 DIAGNOSIS — I129 Hypertensive chronic kidney disease with stage 1 through stage 4 chronic kidney disease, or unspecified chronic kidney disease: Secondary | ICD-10-CM | POA: Diagnosis not present

## 2024-04-28 DIAGNOSIS — E118 Type 2 diabetes mellitus with unspecified complications: Secondary | ICD-10-CM | POA: Diagnosis not present

## 2024-05-04 ENCOUNTER — Other Ambulatory Visit: Payer: Self-pay

## 2024-05-04 DIAGNOSIS — Z1331 Encounter for screening for depression: Secondary | ICD-10-CM | POA: Diagnosis not present

## 2024-05-04 DIAGNOSIS — N183 Chronic kidney disease, stage 3 unspecified: Secondary | ICD-10-CM | POA: Diagnosis not present

## 2024-05-04 DIAGNOSIS — E7800 Pure hypercholesterolemia, unspecified: Secondary | ICD-10-CM | POA: Diagnosis not present

## 2024-05-04 DIAGNOSIS — Z8639 Personal history of other endocrine, nutritional and metabolic disease: Secondary | ICD-10-CM | POA: Diagnosis not present

## 2024-05-04 DIAGNOSIS — Z0000 Encounter for general adult medical examination without abnormal findings: Secondary | ICD-10-CM | POA: Diagnosis not present

## 2024-05-04 DIAGNOSIS — Z125 Encounter for screening for malignant neoplasm of prostate: Secondary | ICD-10-CM | POA: Diagnosis not present

## 2024-05-04 DIAGNOSIS — I129 Hypertensive chronic kidney disease with stage 1 through stage 4 chronic kidney disease, or unspecified chronic kidney disease: Secondary | ICD-10-CM | POA: Diagnosis not present

## 2024-05-04 MED ORDER — SILDENAFIL CITRATE 20 MG PO TABS
40.0000 mg | ORAL_TABLET | Freq: Every day | ORAL | 5 refills | Status: AC | PRN
  Filled 2024-05-04 (×2): qty 20, 4d supply, fill #0
  Filled 2024-05-31 (×2): qty 20, 4d supply, fill #1
  Filled 2024-08-19: qty 20, 4d supply, fill #2

## 2024-05-05 ENCOUNTER — Other Ambulatory Visit: Payer: Self-pay

## 2024-05-18 DIAGNOSIS — Z96641 Presence of right artificial hip joint: Secondary | ICD-10-CM | POA: Diagnosis not present

## 2024-05-24 ENCOUNTER — Other Ambulatory Visit

## 2024-05-24 DIAGNOSIS — E291 Testicular hypofunction: Secondary | ICD-10-CM

## 2024-05-25 ENCOUNTER — Ambulatory Visit: Payer: Self-pay | Admitting: Urology

## 2024-05-25 LAB — HEMOGLOBIN AND HEMATOCRIT, BLOOD
Hematocrit: 48.7 % (ref 37.5–51.0)
Hemoglobin: 15.1 g/dL (ref 13.0–17.7)

## 2024-05-25 LAB — TESTOSTERONE: Testosterone: 720 ng/dL (ref 264–916)

## 2024-05-31 ENCOUNTER — Other Ambulatory Visit: Payer: Self-pay

## 2024-05-31 ENCOUNTER — Other Ambulatory Visit (HOSPITAL_COMMUNITY): Payer: Self-pay

## 2024-06-01 ENCOUNTER — Other Ambulatory Visit (HOSPITAL_COMMUNITY): Payer: Self-pay

## 2024-06-20 ENCOUNTER — Other Ambulatory Visit (HOSPITAL_COMMUNITY): Payer: Self-pay

## 2024-07-26 ENCOUNTER — Other Ambulatory Visit: Payer: Self-pay

## 2024-07-26 DIAGNOSIS — E291 Testicular hypofunction: Secondary | ICD-10-CM

## 2024-07-27 ENCOUNTER — Other Ambulatory Visit

## 2024-07-27 DIAGNOSIS — E291 Testicular hypofunction: Secondary | ICD-10-CM

## 2024-07-28 LAB — HEMOGLOBIN AND HEMATOCRIT, BLOOD
Hematocrit: 49.7 % (ref 37.5–51.0)
Hemoglobin: 15.7 g/dL (ref 13.0–17.7)

## 2024-07-28 LAB — TESTOSTERONE: Testosterone: 342 ng/dL (ref 264–916)

## 2024-07-29 ENCOUNTER — Ambulatory Visit: Payer: Self-pay | Admitting: Urology

## 2024-08-01 NOTE — Progress Notes (Unsigned)
   He presents today for Testopel insertion.  Identified upper outer quadrant of right hip for insertion; prepped area with Betadine and injected 10 cc's of Lidocaine 1%  to anesthetize superficially and distally along trocar tract.  Made 3 mm incision using 11 blade of scalpel; trocar with sharp ended stylet was inserted into subcutaneous tissue in line with femur. Sharp stylet was withdrawn and 6 pellets were placed into trocar well. Testopel pellets advanced into tissue using blunt ended stylet. Trocar removed and incision closed using 6 Steri-Strips. Cleansed area to remove Betadine and covered Steri-Strips with outer Band-Aid.  Careful inspection of insertion is done and patient informed of post procedure instructions.  Advised patient to apply ice to the site for 20-30 minutes every hour if needed.  Avoid hot tubes, swimming or full water immersion of the insertion site for 72 hours.  Bandage may be removed after one week.    Patient is advised to contact the office if experiencing drainage of the insertion site, excessive redness or swelling of the site, chills and/or fevers > 101.5, nausea or vomiting, dizziness or lightheadedness and excessive tenderness.  Avoid strenuous activity and heavy lifting for 72 hours.     He will return in one month for serum testosterone, hemoglobin and hematocrit.

## 2024-08-03 ENCOUNTER — Encounter: Payer: Self-pay | Admitting: Urology

## 2024-08-03 ENCOUNTER — Ambulatory Visit: Admitting: Urology

## 2024-08-03 VITALS — BP 139/94 | HR 89 | Ht 74.0 in | Wt 232.0 lb

## 2024-08-03 DIAGNOSIS — E291 Testicular hypofunction: Secondary | ICD-10-CM | POA: Diagnosis not present

## 2024-08-03 DIAGNOSIS — H524 Presbyopia: Secondary | ICD-10-CM | POA: Diagnosis not present

## 2024-08-03 MED ORDER — TESTOSTERONE 75 MG IL PLLT
75.0000 mg | PELLET | Freq: Once | Status: AC
  Administered 2024-08-03: 75 mg

## 2024-08-19 ENCOUNTER — Other Ambulatory Visit: Payer: Self-pay

## 2024-08-19 DIAGNOSIS — Z96641 Presence of right artificial hip joint: Secondary | ICD-10-CM | POA: Diagnosis not present

## 2024-08-19 MED ORDER — AMOXICILLIN 500 MG PO CAPS
2000.0000 mg | ORAL_CAPSULE | Freq: Once | ORAL | 0 refills | Status: AC
  Filled 2024-08-19: qty 4, 1d supply, fill #0

## 2024-09-05 DIAGNOSIS — Z125 Encounter for screening for malignant neoplasm of prostate: Secondary | ICD-10-CM

## 2024-09-05 DIAGNOSIS — E291 Testicular hypofunction: Secondary | ICD-10-CM

## 2024-09-06 ENCOUNTER — Other Ambulatory Visit

## 2024-09-06 DIAGNOSIS — E291 Testicular hypofunction: Secondary | ICD-10-CM

## 2024-09-06 DIAGNOSIS — Z125 Encounter for screening for malignant neoplasm of prostate: Secondary | ICD-10-CM

## 2024-09-07 ENCOUNTER — Ambulatory Visit: Payer: Self-pay | Admitting: Urology

## 2024-09-07 LAB — TESTOSTERONE: Testosterone: 576 ng/dL (ref 264–916)

## 2024-09-20 ENCOUNTER — Other Ambulatory Visit: Payer: Self-pay

## 2024-09-20 MED ORDER — NA SULFATE-K SULFATE-MG SULF 17.5-3.13-1.6 GM/177ML PO SOLN
ORAL | 0 refills | Status: AC
  Filled 2024-09-20: qty 354, 1d supply, fill #0

## 2024-10-17 ENCOUNTER — Ambulatory Visit: Admit: 2024-10-17

## 2024-10-17 SURGERY — COLONOSCOPY
Anesthesia: General
